# Patient Record
Sex: Male | Born: 1950
Health system: Southern US, Community
[De-identification: ages and names within clinical notes are randomized; demographics above are authoritative.]

## PROBLEM LIST (undated history)

## (undated) DIAGNOSIS — E782 Mixed hyperlipidemia: Secondary | ICD-10-CM

## (undated) DIAGNOSIS — I10 Essential (primary) hypertension: Secondary | ICD-10-CM

## (undated) DIAGNOSIS — I2510 Atherosclerotic heart disease of native coronary artery without angina pectoris: Secondary | ICD-10-CM

## (undated) DIAGNOSIS — I5022 Chronic systolic (congestive) heart failure: Secondary | ICD-10-CM

## (undated) DIAGNOSIS — T8859XA Other complications of anesthesia, initial encounter: Secondary | ICD-10-CM

## (undated) DIAGNOSIS — I219 Acute myocardial infarction, unspecified: Secondary | ICD-10-CM

## (undated) HISTORY — PX: HEMORRHOIDECTOMY WITH HEMORRHOID BANDING: SHX5633

## (undated) HISTORY — DX: Chronic systolic (congestive) heart failure: I50.22

## (undated) HISTORY — PX: XI ROBOTIC ASSISTED INGUINAL HERNIA REPAIR WITH MESH: SHX6706

## (undated) HISTORY — DX: Atherosclerotic heart disease of native coronary artery without angina pectoris: I25.10

---

## 2004-05-21 ENCOUNTER — Emergency Department (HOSPITAL_COMMUNITY): Admission: EM | Admit: 2004-05-21 | Discharge: 2004-05-21 | Payer: Self-pay | Admitting: Emergency Medicine

## 2009-11-02 ENCOUNTER — Ambulatory Visit (HOSPITAL_COMMUNITY): Admission: RE | Admit: 2009-11-02 | Discharge: 2009-11-02 | Payer: Self-pay | Admitting: Surgery

## 2010-05-28 LAB — SURGICAL PCR SCREEN: Staphylococcus aureus: NEGATIVE

## 2010-05-28 LAB — BASIC METABOLIC PANEL
Chloride: 104 mEq/L (ref 96–112)
GFR calc Af Amer: >60 mL/min (ref >60)
Potassium: 4.7 mEq/L (ref 3.5–5.1)
Sodium: 139 mEq/L (ref 135–145)

## 2010-05-28 LAB — DIFFERENTIAL
Eosinophils Relative: 4 % (ref 0–5)
Lymphocytes Relative: 32 % (ref 12–46)
Lymphs Abs: 1.3 10*3/uL (ref 0.7–4.0)
Monocytes Absolute: 0.4 10*3/uL (ref 0.1–1.0)
Monocytes Relative: 10 % (ref 3–12)

## 2010-05-28 LAB — CBC
HCT: 52 % (ref 39.0–52.0)
Hemoglobin: 18 g/dL — ABNORMAL HIGH (ref 13.0–17.0)
MCV: 101 fL — ABNORMAL HIGH (ref 78.0–100.0)
Platelets: 143 10*3/uL — ABNORMAL LOW (ref 150–400)
RBC: 5.15 MIL/uL (ref 4.22–5.81)
WBC: 3.9 10*3/uL — ABNORMAL LOW (ref 4.0–10.5)

## 2012-08-17 ENCOUNTER — Ambulatory Visit: Payer: Self-pay | Admitting: Orthopedic Surgery

## 2013-04-22 ENCOUNTER — Encounter (INDEPENDENT_AMBULATORY_CARE_PROVIDER_SITE_OTHER): Payer: 59 | Admitting: Ophthalmology

## 2013-04-22 DIAGNOSIS — H43819 Vitreous degeneration, unspecified eye: Secondary | ICD-10-CM

## 2013-04-22 DIAGNOSIS — H3530 Unspecified macular degeneration: Secondary | ICD-10-CM

## 2013-04-22 DIAGNOSIS — H2510 Age-related nuclear cataract, unspecified eye: Secondary | ICD-10-CM

## 2014-04-22 ENCOUNTER — Ambulatory Visit (INDEPENDENT_AMBULATORY_CARE_PROVIDER_SITE_OTHER): Payer: 59 | Admitting: Ophthalmology

## 2014-04-22 DIAGNOSIS — H43813 Vitreous degeneration, bilateral: Secondary | ICD-10-CM

## 2014-04-22 DIAGNOSIS — H3531 Nonexudative age-related macular degeneration: Secondary | ICD-10-CM

## 2015-04-23 ENCOUNTER — Ambulatory Visit (INDEPENDENT_AMBULATORY_CARE_PROVIDER_SITE_OTHER): Payer: PPO | Admitting: Ophthalmology

## 2015-04-23 DIAGNOSIS — H35033 Hypertensive retinopathy, bilateral: Secondary | ICD-10-CM | POA: Diagnosis not present

## 2015-04-23 DIAGNOSIS — H43813 Vitreous degeneration, bilateral: Secondary | ICD-10-CM | POA: Diagnosis not present

## 2015-04-23 DIAGNOSIS — H353122 Nonexudative age-related macular degeneration, left eye, intermediate dry stage: Secondary | ICD-10-CM | POA: Diagnosis not present

## 2015-04-23 DIAGNOSIS — H35373 Puckering of macula, bilateral: Secondary | ICD-10-CM | POA: Diagnosis not present

## 2015-04-23 DIAGNOSIS — I10 Essential (primary) hypertension: Secondary | ICD-10-CM | POA: Diagnosis not present

## 2016-02-24 DIAGNOSIS — E782 Mixed hyperlipidemia: Secondary | ICD-10-CM | POA: Diagnosis not present

## 2016-02-24 DIAGNOSIS — Z131 Encounter for screening for diabetes mellitus: Secondary | ICD-10-CM | POA: Diagnosis not present

## 2016-02-24 DIAGNOSIS — E559 Vitamin D deficiency, unspecified: Secondary | ICD-10-CM | POA: Diagnosis not present

## 2016-02-24 DIAGNOSIS — Z125 Encounter for screening for malignant neoplasm of prostate: Secondary | ICD-10-CM | POA: Diagnosis not present

## 2016-02-24 DIAGNOSIS — Z0000 Encounter for general adult medical examination without abnormal findings: Secondary | ICD-10-CM | POA: Diagnosis not present

## 2016-02-24 DIAGNOSIS — Z23 Encounter for immunization: Secondary | ICD-10-CM | POA: Diagnosis not present

## 2016-04-27 ENCOUNTER — Ambulatory Visit (INDEPENDENT_AMBULATORY_CARE_PROVIDER_SITE_OTHER): Payer: PPO | Admitting: Ophthalmology

## 2016-04-27 DIAGNOSIS — H353122 Nonexudative age-related macular degeneration, left eye, intermediate dry stage: Secondary | ICD-10-CM

## 2016-04-27 DIAGNOSIS — H35373 Puckering of macula, bilateral: Secondary | ICD-10-CM

## 2016-04-27 DIAGNOSIS — H43813 Vitreous degeneration, bilateral: Secondary | ICD-10-CM | POA: Diagnosis not present

## 2016-04-27 DIAGNOSIS — H2513 Age-related nuclear cataract, bilateral: Secondary | ICD-10-CM | POA: Diagnosis not present

## 2017-03-28 DIAGNOSIS — Z0000 Encounter for general adult medical examination without abnormal findings: Secondary | ICD-10-CM | POA: Diagnosis not present

## 2017-03-28 DIAGNOSIS — E559 Vitamin D deficiency, unspecified: Secondary | ICD-10-CM | POA: Diagnosis not present

## 2017-03-28 DIAGNOSIS — Z131 Encounter for screening for diabetes mellitus: Secondary | ICD-10-CM | POA: Diagnosis not present

## 2017-03-28 DIAGNOSIS — N400 Enlarged prostate without lower urinary tract symptoms: Secondary | ICD-10-CM | POA: Diagnosis not present

## 2017-03-28 DIAGNOSIS — Z23 Encounter for immunization: Secondary | ICD-10-CM | POA: Diagnosis not present

## 2017-03-28 DIAGNOSIS — E782 Mixed hyperlipidemia: Secondary | ICD-10-CM | POA: Diagnosis not present

## 2017-03-28 DIAGNOSIS — Z125 Encounter for screening for malignant neoplasm of prostate: Secondary | ICD-10-CM | POA: Diagnosis not present

## 2017-03-29 ENCOUNTER — Encounter (INDEPENDENT_AMBULATORY_CARE_PROVIDER_SITE_OTHER): Payer: PPO | Admitting: Ophthalmology

## 2017-03-29 DIAGNOSIS — H43813 Vitreous degeneration, bilateral: Secondary | ICD-10-CM

## 2017-03-29 DIAGNOSIS — H35373 Puckering of macula, bilateral: Secondary | ICD-10-CM | POA: Diagnosis not present

## 2017-03-29 DIAGNOSIS — H353122 Nonexudative age-related macular degeneration, left eye, intermediate dry stage: Secondary | ICD-10-CM

## 2017-03-29 DIAGNOSIS — H2513 Age-related nuclear cataract, bilateral: Secondary | ICD-10-CM

## 2017-05-03 ENCOUNTER — Ambulatory Visit (INDEPENDENT_AMBULATORY_CARE_PROVIDER_SITE_OTHER): Payer: PPO | Admitting: Ophthalmology

## 2018-04-04 ENCOUNTER — Encounter (INDEPENDENT_AMBULATORY_CARE_PROVIDER_SITE_OTHER): Payer: PPO | Admitting: Ophthalmology

## 2018-04-04 DIAGNOSIS — H43813 Vitreous degeneration, bilateral: Secondary | ICD-10-CM

## 2018-04-04 DIAGNOSIS — H353121 Nonexudative age-related macular degeneration, left eye, early dry stage: Secondary | ICD-10-CM | POA: Diagnosis not present

## 2018-04-04 DIAGNOSIS — H2513 Age-related nuclear cataract, bilateral: Secondary | ICD-10-CM

## 2018-04-04 DIAGNOSIS — H35373 Puckering of macula, bilateral: Secondary | ICD-10-CM

## 2018-05-07 DIAGNOSIS — Z0000 Encounter for general adult medical examination without abnormal findings: Secondary | ICD-10-CM | POA: Diagnosis not present

## 2018-05-07 DIAGNOSIS — Z131 Encounter for screening for diabetes mellitus: Secondary | ICD-10-CM | POA: Diagnosis not present

## 2018-05-07 DIAGNOSIS — N400 Enlarged prostate without lower urinary tract symptoms: Secondary | ICD-10-CM | POA: Diagnosis not present

## 2018-05-07 DIAGNOSIS — E782 Mixed hyperlipidemia: Secondary | ICD-10-CM | POA: Diagnosis not present

## 2018-05-07 DIAGNOSIS — E559 Vitamin D deficiency, unspecified: Secondary | ICD-10-CM | POA: Diagnosis not present

## 2018-05-07 DIAGNOSIS — Z23 Encounter for immunization: Secondary | ICD-10-CM | POA: Diagnosis not present

## 2018-05-07 DIAGNOSIS — Z125 Encounter for screening for malignant neoplasm of prostate: Secondary | ICD-10-CM | POA: Diagnosis not present

## 2018-05-08 DIAGNOSIS — K635 Polyp of colon: Secondary | ICD-10-CM | POA: Diagnosis not present

## 2018-05-08 DIAGNOSIS — D123 Benign neoplasm of transverse colon: Secondary | ICD-10-CM | POA: Diagnosis not present

## 2018-05-08 DIAGNOSIS — K5730 Diverticulosis of large intestine without perforation or abscess without bleeding: Secondary | ICD-10-CM | POA: Diagnosis not present

## 2018-05-08 DIAGNOSIS — Z86010 Personal history of colonic polyps: Secondary | ICD-10-CM | POA: Diagnosis not present

## 2018-05-11 DIAGNOSIS — D123 Benign neoplasm of transverse colon: Secondary | ICD-10-CM | POA: Diagnosis not present

## 2018-05-11 DIAGNOSIS — K635 Polyp of colon: Secondary | ICD-10-CM | POA: Diagnosis not present

## 2018-09-16 ENCOUNTER — Inpatient Hospital Stay (HOSPITAL_COMMUNITY)
Admission: EM | Disposition: A | Discharge status: Home / Self Care | Payer: Self-pay | Source: Home / Self Care | Attending: Cardiovascular Disease

## 2018-09-16 ENCOUNTER — Emergency Department (HOSPITAL_COMMUNITY): Admit: 2018-09-16 | Payer: Self-pay | Admitting: Cardiovascular Disease

## 2018-09-16 ENCOUNTER — Encounter (HOSPITAL_COMMUNITY): Payer: Self-pay | Admitting: Cardiovascular Disease

## 2018-09-16 ENCOUNTER — Inpatient Hospital Stay (HOSPITAL_COMMUNITY)
Admission: EM | Admit: 2018-09-16 | Discharge: 2018-09-19 | DRG: 246 | Disposition: A | Discharge status: Home / Self Care | Payer: PPO | Attending: Cardiovascular Disease | Admitting: Cardiovascular Disease

## 2018-09-16 DIAGNOSIS — I499 Cardiac arrhythmia, unspecified: Secondary | ICD-10-CM | POA: Diagnosis not present

## 2018-09-16 DIAGNOSIS — Y840 Cardiac catheterization as the cause of abnormal reaction of the patient, or of later complication, without mention of misadventure at the time of the procedure: Secondary | ICD-10-CM | POA: Diagnosis not present

## 2018-09-16 DIAGNOSIS — R0602 Shortness of breath: Secondary | ICD-10-CM | POA: Diagnosis not present

## 2018-09-16 DIAGNOSIS — Z20828 Contact with and (suspected) exposure to other viral communicable diseases: Secondary | ICD-10-CM | POA: Diagnosis not present

## 2018-09-16 DIAGNOSIS — I2119 ST elevation (STEMI) myocardial infarction involving other coronary artery of inferior wall: Secondary | ICD-10-CM | POA: Diagnosis present

## 2018-09-16 DIAGNOSIS — Y92230 Patient room in hospital as the place of occurrence of the external cause: Secondary | ICD-10-CM | POA: Diagnosis not present

## 2018-09-16 DIAGNOSIS — R079 Chest pain, unspecified: Secondary | ICD-10-CM | POA: Diagnosis not present

## 2018-09-16 DIAGNOSIS — Z955 Presence of coronary angioplasty implant and graft: Secondary | ICD-10-CM

## 2018-09-16 DIAGNOSIS — T45525A Adverse effect of antithrombotic drugs, initial encounter: Secondary | ICD-10-CM | POA: Diagnosis not present

## 2018-09-16 DIAGNOSIS — E663 Overweight: Secondary | ICD-10-CM | POA: Diagnosis not present

## 2018-09-16 DIAGNOSIS — I10 Essential (primary) hypertension: Secondary | ICD-10-CM | POA: Diagnosis not present

## 2018-09-16 DIAGNOSIS — L7632 Postprocedural hematoma of skin and subcutaneous tissue following other procedure: Secondary | ICD-10-CM | POA: Diagnosis not present

## 2018-09-16 DIAGNOSIS — I2111 ST elevation (STEMI) myocardial infarction involving right coronary artery: Secondary | ICD-10-CM | POA: Diagnosis not present

## 2018-09-16 DIAGNOSIS — Z888 Allergy status to other drugs, medicaments and biological substances status: Secondary | ICD-10-CM | POA: Diagnosis not present

## 2018-09-16 DIAGNOSIS — I110 Hypertensive heart disease with heart failure: Secondary | ICD-10-CM | POA: Diagnosis not present

## 2018-09-16 DIAGNOSIS — I2510 Atherosclerotic heart disease of native coronary artery without angina pectoris: Secondary | ICD-10-CM | POA: Diagnosis not present

## 2018-09-16 DIAGNOSIS — I213 ST elevation (STEMI) myocardial infarction of unspecified site: Secondary | ICD-10-CM | POA: Diagnosis not present

## 2018-09-16 DIAGNOSIS — E782 Mixed hyperlipidemia: Secondary | ICD-10-CM | POA: Diagnosis present

## 2018-09-16 DIAGNOSIS — R1111 Vomiting without nausea: Secondary | ICD-10-CM | POA: Diagnosis not present

## 2018-09-16 DIAGNOSIS — I5021 Acute systolic (congestive) heart failure: Secondary | ICD-10-CM | POA: Diagnosis present

## 2018-09-16 DIAGNOSIS — I255 Ischemic cardiomyopathy: Secondary | ICD-10-CM | POA: Diagnosis not present

## 2018-09-16 HISTORY — PX: CORONARY/GRAFT ACUTE MI REVASCULARIZATION: CATH118305

## 2018-09-16 HISTORY — DX: Mixed hyperlipidemia: E78.2

## 2018-09-16 HISTORY — PX: LEFT HEART CATH AND CORONARY ANGIOGRAPHY: CATH118249

## 2018-09-16 HISTORY — PX: CORONARY STENT INTERVENTION: CATH118234

## 2018-09-16 HISTORY — DX: Essential (primary) hypertension: I10

## 2018-09-16 LAB — COMPREHENSIVE METABOLIC PANEL
ALT: 31 U/L (ref 0–44)
AST: 28 U/L (ref 15–41)
Albumin: 4.1 g/dL (ref 3.5–5.0)
Alkaline Phosphatase: 72 U/L (ref 38–126)
Anion gap: 9 (ref 5–15)
BUN: 15 mg/dL (ref 8–23)
CO2: 24 mmol/L (ref 22–32)
Calcium: 9.1 mg/dL (ref 8.9–10.3)
Chloride: 105 mmol/L (ref 98–111)
Creatinine, Ser: 1.02 mg/dL (ref 0.61–1.24)
GFR calc Af Amer: >60 mL/min (ref >60)
GFR calc non Af Amer: >60 mL/min (ref >60)
Glucose, Bld: 144 mg/dL — ABNORMAL HIGH (ref 70–99)
Potassium: 3.8 mmol/L (ref 3.5–5.1)
Sodium: 138 mmol/L (ref 135–145)
Total Bilirubin: 0.5 mg/dL (ref 0.3–1.2)
Total Protein: 6.8 g/dL (ref 6.5–8.1)

## 2018-09-16 LAB — LIPID PANEL
Cholesterol: 193 mg/dL (ref 0–200)
HDL: 32 mg/dL — ABNORMAL LOW (ref >40)
LDL Cholesterol: 139 mg/dL — ABNORMAL HIGH (ref 0–99)
Total CHOL/HDL Ratio: 6 RATIO
Triglycerides: 110 mg/dL (ref <150)
VLDL: 22 mg/dL (ref 0–40)

## 2018-09-16 LAB — HEMOGLOBIN A1C
Hgb A1c MFr Bld: 6.4 % — ABNORMAL HIGH (ref 4.8–5.6)
Hgb A1c MFr Bld: 6.3 % — ABNORMAL HIGH (ref 4.8–5.6)
Mean Plasma Glucose: 136.98 mg/dL
Mean Plasma Glucose: 134.11 mg/dL

## 2018-09-16 LAB — TROPONIN I (HIGH SENSITIVITY)
Troponin I (High Sensitivity): 157 ng/L (ref <18)
Troponin I (High Sensitivity): 2919 ng/L (ref <18)
Troponin I (High Sensitivity): 5321 ng/L (ref <18)

## 2018-09-16 LAB — CBC
HCT: 46.1 % (ref 39.0–52.0)
Hemoglobin: 15.9 g/dL (ref 13.0–17.0)
MCH: 32.5 pg (ref 26.0–34.0)
MCHC: 34.5 g/dL (ref 30.0–36.0)
MCV: 94.3 fL (ref 80.0–100.0)
Platelets: 187 10*3/uL (ref 150–400)
RBC: 4.89 MIL/uL (ref 4.22–5.81)
RDW: 12.6 % (ref 11.5–15.5)
WBC: 7.2 10*3/uL (ref 4.0–10.5)
nRBC: 0 % (ref 0.0–0.2)

## 2018-09-16 LAB — TYPE AND SCREEN
ABO/RH(D): O POS
Antibody Screen: NEGATIVE

## 2018-09-16 LAB — PROTIME-INR
INR: 1.3 — ABNORMAL HIGH (ref 0.8–1.2)
Prothrombin Time: 15.5 seconds — ABNORMAL HIGH (ref 11.4–15.2)

## 2018-09-16 LAB — SARS CORONAVIRUS 2 BY RT PCR (HOSPITAL ORDER, PERFORMED IN ~~LOC~~ HOSPITAL LAB): SARS Coronavirus 2: NEGATIVE

## 2018-09-16 LAB — ABO/RH: ABO/RH(D): O POS

## 2018-09-16 LAB — MRSA PCR SCREENING: MRSA by PCR: NEGATIVE

## 2018-09-16 LAB — APTT: aPTT: >200 seconds (ref 24–36)

## 2018-09-16 SURGERY — CORONARY/GRAFT ACUTE MI REVASCULARIZATION
Anesthesia: LOCAL

## 2018-09-16 MED ORDER — ACETAMINOPHEN 325 MG PO TABS: 650.0000 mg | ORAL_TABLET | ORAL | Status: DC | PRN

## 2018-09-16 MED ORDER — HYDRALAZINE HCL 20 MG/ML IJ SOLN: 10.0000 mg | INTRAMUSCULAR | Status: AC | PRN

## 2018-09-16 MED ORDER — FENTANYL CITRATE (PF) 100 MCG/2ML IJ SOLN
INTRAMUSCULAR | Status: DC | PRN
  Administered 2018-09-16: 50 ug via INTRAVENOUS
  Administered 2018-09-16: 25 ug via INTRAVENOUS
  Administered 2018-09-16: 50 ug via INTRAVENOUS

## 2018-09-16 MED ORDER — LIDOCAINE HCL (PF) 1 % IJ SOLN
INTRAMUSCULAR | Status: AC
  Filled 2018-09-16: qty 30

## 2018-09-16 MED ORDER — NITROGLYCERIN 1 MG/10 ML FOR IR/CATH LAB
INTRA_ARTERIAL | Status: AC
  Filled 2018-09-16: qty 10

## 2018-09-16 MED ORDER — METOPROLOL TARTRATE 25 MG PO TABS
25.0000 mg | ORAL_TABLET | Freq: Two times a day (BID) | ORAL | Status: DC
  Administered 2018-09-16 – 2018-09-18 (×5): 25 mg via ORAL
  Filled 2018-09-16 (×5): qty 1

## 2018-09-16 MED ORDER — SODIUM CHLORIDE 0.9 % IV SOLN
INTRAVENOUS | Status: AC | PRN
  Administered 2018-09-16: 75 mL/h via INTRAVENOUS

## 2018-09-16 MED ORDER — SODIUM CHLORIDE 0.9 % WEIGHT BASED INFUSION: 1.0000 mL/kg/h | INTRAVENOUS | Status: AC

## 2018-09-16 MED ORDER — TICAGRELOR 90 MG PO TABS
ORAL_TABLET | ORAL | Status: DC | PRN
  Administered 2018-09-16: 180 mg via ORAL

## 2018-09-16 MED ORDER — TIROFIBAN (AGGRASTAT) BOLUS VIA INFUSION
INTRAVENOUS | Status: DC | PRN
  Administered 2018-09-16: 17:00:00 2492.5 ug via INTRAVENOUS

## 2018-09-16 MED ORDER — HEPARIN SODIUM (PORCINE) 1000 UNIT/ML IJ SOLN
INTRAMUSCULAR | Status: AC
  Filled 2018-09-16: qty 1

## 2018-09-16 MED ORDER — TICAGRELOR 90 MG PO TABS
90.0000 mg | ORAL_TABLET | Freq: Two times a day (BID) | ORAL | Status: DC
  Administered 2018-09-16 – 2018-09-19 (×6): 90 mg via ORAL
  Filled 2018-09-16 (×6): qty 1

## 2018-09-16 MED ORDER — OXYCODONE HCL 5 MG PO TABS
5.0000 mg | ORAL_TABLET | ORAL | Status: DC | PRN
  Administered 2018-09-17: 10 mg via ORAL
  Filled 2018-09-16: qty 2

## 2018-09-16 MED ORDER — TIROFIBAN HCL IN NACL 5-0.9 MG/100ML-% IV SOLN
INTRAVENOUS | Status: AC
  Filled 2018-09-16: qty 100

## 2018-09-16 MED ORDER — NITROGLYCERIN 1 MG/10 ML FOR IR/CATH LAB
INTRA_ARTERIAL | Status: DC | PRN
  Administered 2018-09-16 (×2): 200 ug via INTRACORONARY

## 2018-09-16 MED ORDER — MORPHINE SULFATE (PF) 2 MG/ML IV SOLN
2.0000 mg | INTRAVENOUS | Status: DC | PRN
  Administered 2018-09-16: 2 mg via INTRAVENOUS
  Filled 2018-09-16: qty 1

## 2018-09-16 MED ORDER — LIDOCAINE HCL (PF) 1 % IJ SOLN
INTRAMUSCULAR | Status: DC | PRN
  Administered 2018-09-16: 2 mL
  Administered 2018-09-16: 15 mL

## 2018-09-16 MED ORDER — MIDAZOLAM HCL 2 MG/2ML IJ SOLN
INTRAMUSCULAR | Status: AC
  Filled 2018-09-16: qty 2

## 2018-09-16 MED ORDER — FENTANYL CITRATE (PF) 100 MCG/2ML IJ SOLN
INTRAMUSCULAR | Status: AC
  Filled 2018-09-16: qty 2

## 2018-09-16 MED ORDER — ONDANSETRON HCL 4 MG/2ML IJ SOLN: 4.0000 mg | Freq: Four times a day (QID) | INTRAMUSCULAR | Status: DC | PRN

## 2018-09-16 MED ORDER — VERAPAMIL HCL 2.5 MG/ML IV SOLN
INTRAVENOUS | Status: AC
  Filled 2018-09-16: qty 2

## 2018-09-16 MED ORDER — SODIUM CHLORIDE 0.9 % IV SOLN: 250.0000 mL | INTRAVENOUS | Status: DC | PRN

## 2018-09-16 MED ORDER — MIDAZOLAM HCL 2 MG/2ML IJ SOLN
INTRAMUSCULAR | Status: DC | PRN
  Administered 2018-09-16 (×2): 2 mg via INTRAVENOUS
  Administered 2018-09-16: 1 mg via INTRAVENOUS

## 2018-09-16 MED ORDER — SODIUM CHLORIDE 0.9% FLUSH: 3.0000 mL | INTRAVENOUS | Status: DC | PRN

## 2018-09-16 MED ORDER — HEPARIN (PORCINE) IN NACL 1000-0.9 UT/500ML-% IV SOLN
INTRAVENOUS | Status: DC | PRN
  Administered 2018-09-16 (×2): 500 mL

## 2018-09-16 MED ORDER — TIROFIBAN HCL IN NACL 5-0.9 MG/100ML-% IV SOLN
INTRAVENOUS | Status: DC | PRN
  Administered 2018-09-16: 0.15 ug/kg/min via INTRAVENOUS

## 2018-09-16 MED ORDER — SODIUM CHLORIDE 0.9% FLUSH
3.0000 mL | Freq: Two times a day (BID) | INTRAVENOUS | Status: DC
  Administered 2018-09-16 – 2018-09-19 (×6): 3 mL via INTRAVENOUS

## 2018-09-16 MED ORDER — HEPARIN SODIUM (PORCINE) 1000 UNIT/ML IJ SOLN
INTRAMUSCULAR | Status: DC | PRN
  Administered 2018-09-16: 10000 [IU] via INTRAVENOUS

## 2018-09-16 MED ORDER — NITROGLYCERIN 0.4 MG SL SUBL: 0.4000 mg | SUBLINGUAL_TABLET | SUBLINGUAL | Status: DC | PRN

## 2018-09-16 MED ORDER — HEPARIN (PORCINE) IN NACL 1000-0.9 UT/500ML-% IV SOLN
INTRAVENOUS | Status: AC
  Filled 2018-09-16: qty 1000

## 2018-09-16 MED ORDER — LABETALOL HCL 5 MG/ML IV SOLN: 10.0000 mg | INTRAVENOUS | Status: AC | PRN

## 2018-09-16 MED ORDER — TICAGRELOR 90 MG PO TABS
ORAL_TABLET | ORAL | Status: AC
  Filled 2018-09-16: qty 2

## 2018-09-16 MED ORDER — HEPARIN (PORCINE) IN NACL 1000-0.9 UT/500ML-% IV SOLN
INTRAVENOUS | Status: DC | PRN
  Administered 2018-09-16 (×2): 500 mL

## 2018-09-16 MED ORDER — ASPIRIN EC 81 MG PO TBEC
81.0000 mg | DELAYED_RELEASE_TABLET | Freq: Every day | ORAL | Status: DC
  Administered 2018-09-17: 10:00:00 81 mg via ORAL
  Filled 2018-09-16: qty 1

## 2018-09-16 SURGICAL SUPPLY — 25 items
BALLN SAPPHIRE 2.5X15 (BALLOONS) ×2
BALLN SAPPHIRE ~~LOC~~ 4.0X18 (BALLOONS) ×2 IMPLANT
BALLOON SAPPHIRE 2.5X15 (BALLOONS) ×1 IMPLANT
CATH 5FR JL3.5 JR4 ANG PIG MP (CATHETERS) ×2 IMPLANT
CATH EXTRAC PRONTO LP 6F RND (CATHETERS) ×2 IMPLANT
CATH INFINITI 5FR JL4 (CATHETERS) ×2 IMPLANT
CATH LAUNCHER 6FR JR4 (CATHETERS) ×2 IMPLANT
DEVICE CLOSURE PERCLS PRGLD 6F (VASCULAR PRODUCTS) ×1 IMPLANT
FEM STOP ARCH (HEMOSTASIS) ×1
GLIDESHEATH SLEND SS 6F .021 (SHEATH) ×2 IMPLANT
GUIDEWIRE INQWIRE 1.5J.035X260 (WIRE) ×1 IMPLANT
INQWIRE 1.5J .035X260CM (WIRE) ×2
KIT ENCORE 26 ADVANTAGE (KITS) ×2 IMPLANT
KIT HEART LEFT (KITS) ×2 IMPLANT
PACK CARDIAC CATHETERIZATION (CUSTOM PROCEDURE TRAY) ×2 IMPLANT
PERCLOSE PROGLIDE 6F (VASCULAR PRODUCTS) ×2
SHEATH PINNACLE 6F 10CM (SHEATH) ×2 IMPLANT
SHEATH PROBE COVER 6X72 (BAG) ×2 IMPLANT
STENT RESOLUTE ONYX 4.0X30 (Permanent Stent) ×2 IMPLANT
SYR MEDRAD MARK 7 150ML (SYRINGE) ×2 IMPLANT
SYSTEM COMPRESSION FEMOSTOP (HEMOSTASIS) ×1 IMPLANT
TRANSDUCER W/STOPCOCK (MISCELLANEOUS) ×2 IMPLANT
TUBING CIL FLEX 10 FLL-RA (TUBING) ×2 IMPLANT
WIRE COUGAR XT STRL 190CM (WIRE) ×2 IMPLANT
WIRE EMERALD 3MM-J .035X150CM (WIRE) ×2 IMPLANT

## 2018-09-16 NOTE — H&P (Signed)
Cardiology Admission History and Physical:   Patient ID: Eric Pace MRN: [id number]; DOB: May 19, 1950   Admission date: 09/16/2018  Primary Care Provider: [name], MD Primary Cardiologist: No primary care provider on file.  Primary Electrophysiologist:  None   Chief Complaint:  Chest pain  Patient Profile:   Eric Pace is a 68 y.o. male with no significant past medical history who presents with acute onset chest pain and is diagnosed as a code STEMI from the field, brought directly to the cardiac catheterization lab.  History of Present Illness:   Eric Pace is 69 years old with no significant medical problems.  He only takes eyedrops for medication.  States that he has had "borderline high blood pressure".  He has been statin intolerant.  He was in his normal state of health until 1 PM today when he developed abrupt onset of substernal chest pain radiating to both shoulders, characterized as a pressure-like sensation.  There was associated nausea but no vomiting.  There was associated diaphoresis.  He denies shortness of breath, orthopnea, or PND.  He denies lightheadedness or syncope.  He was driven by a friend towards the hospital, but decided to stop at the fire station because he was feeling so poorly.  He was picked up by EMS at the fire station and an EKG demonstrated an acute inferior STEMI.  A code STEMI is called and he is brought directly to the cardiac catheterization lab after briefly stopping in the emergency department for a COVID-19 swab.  The patient has had no symptoms suggestive of COVID-19, nor has he had any sick contacts.  Heart Pathway Score:     Past Medical History:  Diagnosis Date  . Essential hypertension   . Mixed hyperlipidemia     Past Surgical History:  Procedure Laterality Date  . HEMORRHOIDECTOMY WITH HEMORRHOID BANDING       Medications Prior to Admission: Prior to Admission medications   Not on File     Allergies:   Not on File   Social History:   Social History   Socioeconomic History  . Marital status: Married    Spouse name: Not on file  . Number of children: Not on file  . Years of education: Not on file  . Highest education level: Not on file  Occupational History  . Not on file  Social Needs  . Financial resource strain: Not on file  . Food insecurity    Worry: Not on file    Inability: Not on file  . Transportation needs    Medical: Not on file    Non-medical: Not on file  Tobacco Use  . Smoking status: Never Smoker  . Smokeless tobacco: Never Used  Substance and Sexual Activity  . Alcohol use: Not Currently  . Drug use: Never  . Sexual activity: Not on file  Lifestyle  . Physical activity    Days per week: Not on file    Minutes per session: Not on file  . Stress: Not on file  Relationships  . Social Herbalist on phone: Not on file    Gets together: Not on file    Attends religious service: Not on file    Active member of club or organization: Not on file    Attends meetings of clubs or organizations: Not on file    Relationship status: Not on file  . Intimate partner violence    Fear of current or ex partner: Not on  file    Emotionally abused: Not on file    Physically abused: Not on file    Forced sexual activity: Not on file  Other Topics Concern  . Not on file  Social History Narrative  . Not on file    Family History:   The patient's family history is not on file. He was adopted.  Family history is unknown as he was adopted.  ROS:  Please see the history of present illness.  All other ROS reviewed and negative.     Physical Exam/Data:  There were no vitals filed for this visit. No intake or output data in the 24 hours ending 09/16/18 1620 No flowsheet data found.   There is no height or weight on file to calculate BMI.  General:  Well nourished, well developed, in mild to moderate acute distress secondary to chest discomfort.  No respiratory distress.  HEENT: normal Lymph: no adenopathy Neck: no JVD Endocrine:  No thryomegaly Vascular: No carotid bruits; FA pulses 2+ bilaterally Cardiac:  normal S1, S2; RRR; no murmur  Lungs:  clear to auscultation bilaterally, no wheezing, rhonchi or rales  Abd: soft, nontender, no hepatomegaly  Ext: no edema Musculoskeletal:  No deformities, BUE and BLE strength normal and equal Skin: warm and dry  Neuro:  CNs 2-12 intact, no focal abnormalities noted Psych:  Normal affect    EKG:  The ECG that was done today was personally reviewed and demonstrates normal sinus rhythm with acute inferoposterior STEMI pattern  Relevant CV Studies: Pending  Laboratory Data:  High Sensitivity Troponin:  No results for input(s): TROPONINIHS in the last 720 hours.    Cardiac EnzymesNo results for input(s): TROPONINI in the last 168 hours. No results for input(s): TROPIPOC in the last 168 hours.  ChemistryNo results for input(s): NA, K, CL, CO2, GLUCOSE, BUN, CREATININE, CALCIUM, GFRNONAA, GFRAA, ANIONGAP in the last 168 hours.  No results for input(s): PROT, ALBUMIN, AST, ALT, ALKPHOS, BILITOT in the last 168 hours. HematologyNo results for input(s): WBC, RBC, HGB, HCT, MCV, MCH, MCHC, RDW, PLT in the last 168 hours. BNPNo results for input(s): BNP, PROBNP in the last 168 hours.  DDimer No results for input(s): DDIMER in the last 168 hours.   Radiology/Studies:  No results found.  Assessment and Plan:   1. Acute inferoposterior STEMI: The patient is brought directly to the cardiac catheterization lab for emergency catheterization and primary PCI.  He has received aspirin 500 mg orally administered by himself at home prior to arrival.  No other pharmacotherapy has been administered at this point.  Anticipate full dose IV heparin, ticagrelor loading, and possibility of a to be 3 a inhibitor pending thrombus burden.  Further plan/disposition pending his cardiac catheterization results. 2. Hypertension: We will  start him on a beta-blocker and adjust his antihypertensive therapy as indicated. 3. Suspected mixed hyperlipidemia: We will check fasting lipids and try him on a statin drug if he can tolerate.  He does report a statin allergy with facial numbness as the primary symptom.  We may need to consider a PCSK9 inhibitor.  Severity of Illness: The appropriate patient status for this patient is INPATIENT. Inpatient status is judged to be reasonable and necessary in order to provide the required intensity of service to ensure the patient's safety. The patient's presenting symptoms, physical exam findings, and initial radiographic and laboratory data in the context of their chronic comorbidities is felt to place them at high risk for further clinical deterioration. Furthermore, it is  not anticipated that the patient will be medically stable for discharge from the hospital within 2 midnights of admission.    * I certify that at the point of admission it is my clinical judgment that the patient will require inpatient hospital care spanning beyond 2 midnights from the point of admission due to high intensity of service, high risk for further deterioration and high frequency of surveillance required.*    For questions or updates, please contact [place] Please consult [email or web address] for contact info under        Signed, Sherren Mocha, MD  09/16/2018 4:20 PM

## 2018-09-16 NOTE — Progress Notes (Signed)
Dr. Burt Knack paged re:  Results of PTT and Trop.  No new orders received. Will continue to monitor closely.

## 2018-09-16 NOTE — ED Notes (Signed)
Wife, Brayam Boeke [phone]

## 2018-09-16 NOTE — Progress Notes (Signed)
This chaplain phoned the ED at the anticipated arrival time for Pt. Code Stemi.  The chaplain was informed by [place], [place]. has not arrived.  The chaplain understands the ED will contact spiritual care as needed.

## 2018-09-17 ENCOUNTER — Other Ambulatory Visit: Payer: Self-pay

## 2018-09-17 ENCOUNTER — Encounter (HOSPITAL_COMMUNITY): Payer: Self-pay | Admitting: Cardiovascular Disease

## 2018-09-17 ENCOUNTER — Inpatient Hospital Stay (HOSPITAL_COMMUNITY): Payer: PPO

## 2018-09-17 DIAGNOSIS — I2111 ST elevation (STEMI) myocardial infarction involving right coronary artery: Secondary | ICD-10-CM

## 2018-09-17 LAB — BASIC METABOLIC PANEL
Anion gap: 10 (ref 5–15)
Anion gap: 14 (ref 5–15)
BUN: 14 mg/dL (ref 8–23)
BUN: 13 mg/dL (ref 8–23)
CO2: 24 mmol/L (ref 22–32)
CO2: 18 mmol/L — ABNORMAL LOW (ref 22–32)
Calcium: 8.9 mg/dL (ref 8.9–10.3)
Calcium: 9 mg/dL (ref 8.9–10.3)
Chloride: 103 mmol/L (ref 98–111)
Chloride: 103 mmol/L (ref 98–111)
Creatinine, Ser: 0.87 mg/dL (ref 0.61–1.24)
Creatinine, Ser: 1.02 mg/dL (ref 0.61–1.24)
GFR calc Af Amer: >60 mL/min (ref >60)
GFR calc Af Amer: >60 mL/min (ref >60)
GFR calc non Af Amer: >60 mL/min (ref >60)
GFR calc non Af Amer: >60 mL/min (ref >60)
Glucose, Bld: 139 mg/dL — ABNORMAL HIGH (ref 70–99)
Glucose, Bld: 129 mg/dL — ABNORMAL HIGH (ref 70–99)
Potassium: 3.7 mmol/L (ref 3.5–5.1)
Potassium: 3.9 mmol/L (ref 3.5–5.1)
Sodium: 137 mmol/L (ref 135–145)
Sodium: 135 mmol/L (ref 135–145)

## 2018-09-17 LAB — CBC
HCT: 42.7 % (ref 39.0–52.0)
HCT: 43.3 % (ref 39.0–52.0)
Hemoglobin: 14.7 g/dL (ref 13.0–17.0)
Hemoglobin: 14.7 g/dL (ref 13.0–17.0)
MCH: 32.7 pg (ref 26.0–34.0)
MCH: 32.5 pg (ref 26.0–34.0)
MCHC: 34.4 g/dL (ref 30.0–36.0)
MCHC: 33.9 g/dL (ref 30.0–36.0)
MCV: 94.9 fL (ref 80.0–100.0)
MCV: 95.6 fL (ref 80.0–100.0)
Platelets: 166 10*3/uL (ref 150–400)
Platelets: 192 10*3/uL (ref 150–400)
RBC: 4.5 MIL/uL (ref 4.22–5.81)
RBC: 4.53 MIL/uL (ref 4.22–5.81)
RDW: 12.6 % (ref 11.5–15.5)
RDW: 12.8 % (ref 11.5–15.5)
WBC: 9.8 10*3/uL (ref 4.0–10.5)
WBC: 9.1 10*3/uL (ref 4.0–10.5)
nRBC: 0 % (ref 0.0–0.2)
nRBC: 0 % (ref 0.0–0.2)

## 2018-09-17 LAB — POCT I-STAT 7, (LYTES, BLD GAS, ICA,H+H)
Bicarbonate: 26.5 mmol/L (ref 20.0–28.0)
Calcium, Ion: 1.27 mmol/L (ref 1.15–1.40)
HCT: 48 % (ref 39.0–52.0)
Hemoglobin: 16.3 g/dL (ref 13.0–17.0)
O2 Saturation: 93 %
Potassium: 3.8 mmol/L (ref 3.5–5.1)
Sodium: 141 mmol/L (ref 135–145)
TCO2: 28 mmol/L (ref 22–32)
pCO2 arterial: 47.4 mmHg (ref 32.0–48.0)
pH, Arterial: 7.356 (ref 7.350–7.450)
pO2, Arterial: 70 mmHg — ABNORMAL LOW (ref 83.0–108.0)

## 2018-09-17 LAB — LIPID PANEL
Cholesterol: 176 mg/dL (ref 0–200)
HDL: 31 mg/dL — ABNORMAL LOW (ref >40)
LDL Cholesterol: 101 mg/dL — ABNORMAL HIGH (ref 0–99)
Total CHOL/HDL Ratio: 5.7 RATIO
Triglycerides: 222 mg/dL — ABNORMAL HIGH (ref <150)
VLDL: 44 mg/dL — ABNORMAL HIGH (ref 0–40)

## 2018-09-17 LAB — ECHOCARDIOGRAM COMPLETE: Weight: 3608.49 oz

## 2018-09-17 LAB — POCT I-STAT CREATININE: Creatinine, Ser: 0.9 mg/dL (ref 0.61–1.24)

## 2018-09-17 LAB — POCT ACTIVATED CLOTTING TIME
Activated Clotting Time: 257 seconds
Activated Clotting Time: 268 seconds

## 2018-09-17 LAB — TROPONIN I (HIGH SENSITIVITY)
Troponin I (High Sensitivity): 8585 ng/L (ref <18)
Troponin I (High Sensitivity): 6239 ng/L (ref <18)

## 2018-09-17 MED ORDER — SODIUM CHLORIDE 0.9 % WEIGHT BASED INFUSION
1.0000 mL/kg/h | INTRAVENOUS | Status: DC
  Administered 2018-09-17: 1 mL/kg/h via INTRAVENOUS

## 2018-09-17 MED ORDER — ASPIRIN 81 MG PO CHEW
81.0000 mg | CHEWABLE_TABLET | ORAL | Status: AC
  Administered 2018-09-18: 06:00:00 81 mg via ORAL
  Filled 2018-09-17: qty 1

## 2018-09-17 MED ORDER — SODIUM CHLORIDE 0.9 % IV SOLN: 250.0000 mL | INTRAVENOUS | Status: DC | PRN

## 2018-09-17 MED ORDER — EZETIMIBE 10 MG PO TABS
10.0000 mg | ORAL_TABLET | Freq: Every day | ORAL | Status: DC
  Administered 2018-09-17 – 2018-09-19 (×3): 10 mg via ORAL
  Filled 2018-09-17 (×3): qty 1

## 2018-09-17 MED ORDER — SODIUM CHLORIDE 0.9% FLUSH: 3.0000 mL | INTRAVENOUS | Status: DC | PRN

## 2018-09-17 MED ORDER — TICAGRELOR 90 MG PO TABS: 90.0000 mg | ORAL_TABLET | Freq: Two times a day (BID) | ORAL | Status: DC

## 2018-09-17 MED ORDER — CHLORHEXIDINE GLUCONATE CLOTH 2 % EX PADS
6.0000 | MEDICATED_PAD | Freq: Every day | CUTANEOUS | Status: DC
  Administered 2018-09-17 – 2018-09-18 (×2): 6 via TOPICAL

## 2018-09-17 MED ORDER — ASPIRIN EC 81 MG PO TBEC
81.0000 mg | DELAYED_RELEASE_TABLET | Freq: Every day | ORAL | Status: DC
  Administered 2018-09-19: 81 mg via ORAL
  Filled 2018-09-17: qty 1

## 2018-09-17 MED ORDER — SODIUM CHLORIDE 0.9% FLUSH
3.0000 mL | Freq: Two times a day (BID) | INTRAVENOUS | Status: DC
  Administered 2018-09-17: 3 mL via INTRAVENOUS

## 2018-09-17 MED FILL — Verapamil HCl IV Soln 2.5 MG/ML: INTRAVENOUS | Qty: 2 | Status: AC

## 2018-09-17 MED FILL — Heparin Sod (Porcine)-NaCl IV Soln 1000 Unit/500ML-0.9%: INTRAVENOUS | Qty: 1000 | Status: AC

## 2018-09-17 NOTE — Progress Notes (Signed)
Progress Note  Patient Name: Eric Pace Date of Encounter: 09/17/2018  Primary Cardiologist: Dr. Sherren Mocha  Subjective   Postop day #1 inferoposterior STEMI treated with PCI and drug-eluting stenting of a dominant RCA with residual LAD/diagonal branch bifurcation disease.  He feels clinically improved denies chest pain.  Did have some shortness of breath this morning potentially related to Brilinta.  He has a large area of ecchymosis in his groin extending to his scrotum.  Inpatient Medications    Scheduled Meds: . aspirin EC  81 mg Oral Daily  . metoprolol tartrate  25 mg Oral BID  . sodium chloride flush  3 mL Intravenous Q12H  . ticagrelor  90 mg Oral BID   Continuous Infusions: . sodium chloride     PRN Meds: sodium chloride, acetaminophen, morphine injection, nitroGLYCERIN, ondansetron (ZOFRAN) IV, oxyCODONE, sodium chloride flush   Vital Signs    Vitals:   09/17/18 0500 09/17/18 0600 09/17/18 0700 09/17/18 0816  BP: 139/85 (!) 160/93 140/84   Pulse: 65 83 73   Resp: 14 17 18    Temp:    98.6 F (37 C)  TempSrc:    Oral  SpO2: 99% 99% 99%   Weight:        Intake/Output Summary (Last 24 hours) at 09/17/2018 0828 Last data filed at 09/17/2018 0816 Gross per 24 hour  Intake 1100 ml  Output 725 ml  Net 375 ml   Last 3 Weights 09/16/2018  Weight (lbs) 225 lb 8.5 oz  Weight (kg) 102.3 kg      Telemetry    Sinus rhythm- Personally Reviewed  ECG    Normal sinus rhythm at 72 with inferior Q waves and improved lateral T wave inversion- Personally Reviewed  Physical Exam   GEN: No acute distress.   Neck: No JVD Cardiac: RRR, no murmurs, rubs, or gallops.  Respiratory: Clear to auscultation bilaterally. GI: Soft, nontender, non-distended  MS: No edema; No deformity. Neuro:  Nonfocal  Psych: Normal affect  Extremities: Large area of ecchymosis in right groin puncture site without significant hematoma.  Large area of scrotal ecchymosis as well which is  somewhat sensitive  Labs    High Sensitivity Troponin:   Recent Labs  Lab 09/16/18 1642 09/16/18 1914 09/16/18 2104  TROPONINIHS 157* 2,919* 5,321*      Cardiac EnzymesNo results for input(s): TROPONINI in the last 168 hours. No results for input(s): TROPIPOC in the last 168 hours.   Chemistry Recent Labs  Lab 09/16/18 1642 09/17/18 0227  NA 138 137  K 3.8 3.7  CL 105 103  CO2 24 24  GLUCOSE 144* 139*  BUN 15 14  CREATININE 1.02 0.87  CALCIUM 9.1 8.9  PROT 6.8  --   ALBUMIN 4.1  --   AST 28  --   ALT 31  --   ALKPHOS 72  --   BILITOT 0.5  --   GFRNONAA >60 >60  GFRAA >60 >60  ANIONGAP 9 10     Hematology Recent Labs  Lab 09/16/18 1642 09/17/18 0227  WBC 7.2 9.8  RBC 4.89 4.50  HGB 15.9 14.7  HCT 46.1 42.7  MCV 94.3 94.9  MCH 32.5 32.7  MCHC 34.5 34.4  RDW 12.6 12.6  PLT 187 166    BNPNo results for input(s): BNP, PROBNP in the last 168 hours.   DDimer No results for input(s): DDIMER in the last 168 hours.   Radiology    No results found.  Cardiac Studies  Cardiac catheterization/PCI and stent (09/16/2018)  Conclusion  1.  Acute inferoposterior MI secondary to subtotal occlusion of the RCA, treated successfully with aspiration thrombectomy, PTCA, and drug-eluting stent implantation 2.  Severe residual stenosis involving the LAD/first diagonal 3.  Patent left main and left circumflex without high-grade obstruction 4.  Mild to moderate segmental LV systolic dysfunction with akinesis of the inferior wall and estimated LVEF of 45%  Recommendations: Staged PCI of the LAD/diagonal during the patient's index hospitalization.  CCU care and close monitoring of the patient's groin hematoma.  Type and screen is done.  Patient is hemodynamically stable.     Intervention      Patient Profile     68 y.o. moderately overweight married Caucasian male who lives in [place] had new onset chest pain yesterday at 1 PM.  He went to local  fire station where he was found to have an inferior posterior STEMI and he was transported urgently to the Cath Lab where Dr. Burt Knack performed PCI and drug-eluting stenting via the right femoral approach.  It was residual LAD/diagonal branch bifurcation disease.  He does have a history of hyperlipidemia intolerant to statin therapy.  He will be scheduled for staged LAD PCI tomorrow.  Assessment & Plan    1: Inferoposterior MI-postop day 1 inferoposterior STEMI with PCI and drug-eluting of a thrombotic mid dominant RCA.  Patient had aspiration thrombectomy and was on Aggrastat briefly.  Dr. Burt Knack was unable to thread a wire in the right radial artery despite 2 attempts and defaulted to femoral approach.  He did Perclose this but there was a significant hematoma requiring a FemoStop with residual ecchymosis extending to the scrotum.  The patient is on low-dose aspirin and ticagrelor.  He has severe inferior hypokinesia/akinesia..  Plan staged LAD/diagonal branch PCI and stenting tomorrow with Dr. Burt Knack.  2: Hyperlipidemia- total cholesterol 176 with an LDL of 101.  He has tried several statins in the past with side effects.  He would be a good candidate for PCSK9.  Will refer to the lipid clinic at discharge.  Out of bed to chair today.  Will write pre-PCI orders for staged intervention tomorrow  For questions or updates, please contact [place] Please consult [email or web address] for contact info under        Signed, Quay Burow, MD  09/17/2018, 8:28 AM

## 2018-09-17 NOTE — Progress Notes (Signed)
  Echocardiogram 2D Echocardiogram has been performed.  Eric Pace 09/17/2018, 11:26 AM

## 2018-09-17 NOTE — Care Management (Signed)
Brilinta benefits check sent and pending.  Jo-Anne Kluth RN, BSN, NCM-BC, ACM-RN [phone] 

## 2018-09-17 NOTE — Progress Notes (Signed)
[id number] Waited for 2D ECHO to be finished so I could begin ed. MI education completed except for ex ed. Will follow up and walk pt and finish ed at another visit. Discussed NTG use,MI restrictions, importance of brilinta, heart healthy and low carb food choices, and CRP 2. Referred to [place] CRP 2. Understanding voiced.  Pt is interested in participating in Virtual Cardiac Rehab. Pt advised that Virtual Cardiac Rehab is provided at no cost to the patient.  Checklist:  1. Pt has smart device  ie smartphone and/or ipad for downloading an app  Yes 2. Reliable internet/wifi service    Yes 3. Understands how to use their smartphone and navigate within an app.  Yes   Reviewed with pt the scheduling process for virtual cardiac rehab.  Pt verbalized understanding.

## 2018-09-17 NOTE — Care Management (Signed)
Per [facility] pharmacy co-pay for Brilinta twice a day $45.00 for a 30 day supply, 60 for 30. [facility] drug,No deductible,No PA required,  Pharmacy: Any Pharmacy pt. choose to use.

## 2018-09-17 NOTE — H&P (View-Only) (Signed)
Progress Note  Patient Name: Eric Pace Date of Encounter: 09/17/2018  Primary Cardiologist: Dr. Sherren Mocha  Subjective   Postop day #1 inferoposterior STEMI treated with PCI and drug-eluting stenting of a dominant RCA with residual LAD/diagonal branch bifurcation disease.  He feels clinically improved denies chest pain.  Did have some shortness of breath this morning potentially related to Brilinta.  He has a large area of ecchymosis in his groin extending to his scrotum.  Inpatient Medications    Scheduled Meds: . aspirin EC  81 mg Oral Daily  . metoprolol tartrate  25 mg Oral BID  . sodium chloride flush  3 mL Intravenous Q12H  . ticagrelor  90 mg Oral BID   Continuous Infusions: . sodium chloride     PRN Meds: sodium chloride, acetaminophen, morphine injection, nitroGLYCERIN, ondansetron (ZOFRAN) IV, oxyCODONE, sodium chloride flush   Vital Signs    Vitals:   09/17/18 0500 09/17/18 0600 09/17/18 0700 09/17/18 0816  BP: 139/85 (!) 160/93 140/84   Pulse: 65 83 73   Resp: 14 17 18    Temp:    98.6 F (37 C)  TempSrc:    Oral  SpO2: 99% 99% 99%   Weight:        Intake/Output Summary (Last 24 hours) at 09/17/2018 0828 Last data filed at 09/17/2018 0816 Gross per 24 hour  Intake 1100 ml  Output 725 ml  Net 375 ml   Last 3 Weights 09/16/2018  Weight (lbs) 225 lb 8.5 oz  Weight (kg) 102.3 kg      Telemetry    Sinus rhythm- Personally Reviewed  ECG    Normal sinus rhythm at 72 with inferior Q waves and improved lateral T wave inversion- Personally Reviewed  Physical Exam   GEN: No acute distress.   Neck: No JVD Cardiac: RRR, no murmurs, rubs, or gallops.  Respiratory: Clear to auscultation bilaterally. GI: Soft, nontender, non-distended  MS: No edema; No deformity. Neuro:  Nonfocal  Psych: Normal affect  Extremities: Large area of ecchymosis in right groin puncture site without significant hematoma.  Large area of scrotal ecchymosis as well which is  somewhat sensitive  Labs    High Sensitivity Troponin:   Recent Labs  Lab 09/16/18 1642 09/16/18 1914 09/16/18 2104  TROPONINIHS 157* 2,919* 5,321*      Cardiac EnzymesNo results for input(s): TROPONINI in the last 168 hours. No results for input(s): TROPIPOC in the last 168 hours.   Chemistry Recent Labs  Lab 09/16/18 1642 09/17/18 0227  NA 138 137  K 3.8 3.7  CL 105 103  CO2 24 24  GLUCOSE 144* 139*  BUN 15 14  CREATININE 1.02 0.87  CALCIUM 9.1 8.9  PROT 6.8  --   ALBUMIN 4.1  --   AST 28  --   ALT 31  --   ALKPHOS 72  --   BILITOT 0.5  --   GFRNONAA >60 >60  GFRAA >60 >60  ANIONGAP 9 10     Hematology Recent Labs  Lab 09/16/18 1642 09/17/18 0227  WBC 7.2 9.8  RBC 4.89 4.50  HGB 15.9 14.7  HCT 46.1 42.7  MCV 94.3 94.9  MCH 32.5 32.7  MCHC 34.5 34.4  RDW 12.6 12.6  PLT 187 166    BNPNo results for input(s): BNP, PROBNP in the last 168 hours.   DDimer No results for input(s): DDIMER in the last 168 hours.   Radiology    No results found.  Cardiac Studies  Cardiac catheterization/PCI and stent (09/16/2018)  Conclusion  1.  Acute inferoposterior MI secondary to subtotal occlusion of the RCA, treated successfully with aspiration thrombectomy, PTCA, and drug-eluting stent implantation 2.  Severe residual stenosis involving the LAD/first diagonal 3.  Patent left main and left circumflex without high-grade obstruction 4.  Mild to moderate segmental LV systolic dysfunction with akinesis of the inferior wall and estimated LVEF of 45%  Recommendations: Staged PCI of the LAD/diagonal during the patient's index hospitalization.  CCU care and close monitoring of the patient's groin hematoma.  Type and screen is done.  Patient is hemodynamically stable.     Intervention      Patient Profile     68 y.o. moderately overweight married Caucasian male who lives in [place] had new onset chest pain yesterday at 1 PM.  He went to local  fire station where he was found to have an inferior posterior STEMI and he was transported urgently to the Cath Lab where Dr. Burt Knack performed PCI and drug-eluting stenting via the right femoral approach.  It was residual LAD/diagonal branch bifurcation disease.  He does have a history of hyperlipidemia intolerant to statin therapy.  He will be scheduled for staged LAD PCI tomorrow.  Assessment & Plan    1: Inferoposterior MI-postop day 1 inferoposterior STEMI with PCI and drug-eluting of a thrombotic mid dominant RCA.  Patient had aspiration thrombectomy and was on Aggrastat briefly.  Dr. Burt Knack was unable to thread a wire in the right radial artery despite 2 attempts and defaulted to femoral approach.  He did Perclose this but there was a significant hematoma requiring a FemoStop with residual ecchymosis extending to the scrotum.  The patient is on low-dose aspirin and ticagrelor.  He has severe inferior hypokinesia/akinesia..  Plan staged LAD/diagonal branch PCI and stenting tomorrow with Dr. Burt Knack.  2: Hyperlipidemia- total cholesterol 176 with an LDL of 101.  He has tried several statins in the past with side effects.  He would be a good candidate for PCSK9.  Will refer to the lipid clinic at discharge.  Out of bed to chair today.  Will write pre-PCI orders for staged intervention tomorrow  For questions or updates, please contact [place] Please consult [email or web address] for contact info under        Signed, Quay Burow, MD  09/17/2018, 8:28 AM

## 2018-09-18 ENCOUNTER — Encounter (HOSPITAL_COMMUNITY): Payer: Self-pay | Admitting: Cardiovascular Disease

## 2018-09-18 ENCOUNTER — Encounter (HOSPITAL_COMMUNITY)
Admission: EM | Disposition: A | Discharge status: Home / Self Care | Payer: Self-pay | Source: Home / Self Care | Attending: Cardiovascular Disease

## 2018-09-18 HISTORY — PX: CORONARY STENT INTERVENTION: CATH118234

## 2018-09-18 LAB — HIV ANTIBODY (ROUTINE TESTING W REFLEX): HIV Screen 4th Generation wRfx: NONREACTIVE

## 2018-09-18 SURGERY — CORONARY STENT INTERVENTION
Anesthesia: LOCAL

## 2018-09-18 MED ORDER — HYDRALAZINE HCL 20 MG/ML IJ SOLN: 10.0000 mg | INTRAMUSCULAR | Status: AC | PRN

## 2018-09-18 MED ORDER — BIVALIRUDIN TRIFLUOROACETATE 250 MG IV SOLR
INTRAVENOUS | Status: AC
  Filled 2018-09-18: qty 250

## 2018-09-18 MED ORDER — MIDAZOLAM HCL 2 MG/2ML IJ SOLN
INTRAMUSCULAR | Status: AC
  Filled 2018-09-18: qty 2

## 2018-09-18 MED ORDER — SODIUM CHLORIDE 0.9 % WEIGHT BASED INFUSION
1.0000 mL/kg/h | INTRAVENOUS | Status: AC
  Administered 2018-09-18 (×2): 1 mL/kg/h via INTRAVENOUS

## 2018-09-18 MED ORDER — BIVALIRUDIN BOLUS VIA INFUSION - CUPID
INTRAVENOUS | Status: DC | PRN
  Administered 2018-09-18: 09:00:00 76.725 mg via INTRAVENOUS

## 2018-09-18 MED ORDER — NITROGLYCERIN 1 MG/10 ML FOR IR/CATH LAB
INTRA_ARTERIAL | Status: AC
  Filled 2018-09-18: qty 10

## 2018-09-18 MED ORDER — HEPARIN (PORCINE) IN NACL 1000-0.9 UT/500ML-% IV SOLN
INTRAVENOUS | Status: AC
  Filled 2018-09-18: qty 1000

## 2018-09-18 MED ORDER — VERAPAMIL HCL 2.5 MG/ML IV SOLN
INTRAVENOUS | Status: AC
  Filled 2018-09-18: qty 2

## 2018-09-18 MED ORDER — FENTANYL CITRATE (PF) 100 MCG/2ML IJ SOLN
INTRAMUSCULAR | Status: DC | PRN
  Administered 2018-09-18 (×2): 25 ug via INTRAVENOUS

## 2018-09-18 MED ORDER — LABETALOL HCL 5 MG/ML IV SOLN: 10.0000 mg | INTRAVENOUS | Status: AC | PRN

## 2018-09-18 MED ORDER — LIDOCAINE HCL (PF) 1 % IJ SOLN
INTRAMUSCULAR | Status: AC
  Filled 2018-09-18: qty 30

## 2018-09-18 MED ORDER — FENTANYL CITRATE (PF) 100 MCG/2ML IJ SOLN
INTRAMUSCULAR | Status: AC
  Filled 2018-09-18: qty 2

## 2018-09-18 MED ORDER — NITROGLYCERIN 1 MG/10 ML FOR IR/CATH LAB
INTRA_ARTERIAL | Status: DC | PRN
  Administered 2018-09-18 (×3): 150 ug via INTRACORONARY

## 2018-09-18 MED ORDER — HEPARIN (PORCINE) IN NACL 1000-0.9 UT/500ML-% IV SOLN
INTRAVENOUS | Status: DC | PRN
  Administered 2018-09-18 (×2): 500 mL

## 2018-09-18 MED ORDER — MIDAZOLAM HCL 2 MG/2ML IJ SOLN
INTRAMUSCULAR | Status: DC | PRN
  Administered 2018-09-18: 1 mg via INTRAVENOUS
  Administered 2018-09-18: 2 mg via INTRAVENOUS

## 2018-09-18 MED ORDER — SODIUM CHLORIDE 0.9% FLUSH: 3.0000 mL | INTRAVENOUS | Status: DC | PRN

## 2018-09-18 MED ORDER — IOHEXOL 350 MG/ML SOLN
INTRAVENOUS | Status: DC | PRN
  Administered 2018-09-18: 09:00:00 90 mL via INTRA_ARTERIAL

## 2018-09-18 MED ORDER — SODIUM CHLORIDE 0.9 % IV SOLN: 250.0000 mL | INTRAVENOUS | Status: DC | PRN

## 2018-09-18 MED ORDER — SODIUM CHLORIDE 0.9 % IV SOLN
INTRAVENOUS | Status: DC | PRN
  Administered 2018-09-18: 09:00:00 1.75 mg/kg/h via INTRAVENOUS
  Administered 2018-09-18: 09:00:00

## 2018-09-18 MED ORDER — SODIUM CHLORIDE 0.9% FLUSH
3.0000 mL | Freq: Two times a day (BID) | INTRAVENOUS | Status: DC
  Administered 2018-09-19: 3 mL via INTRAVENOUS

## 2018-09-18 MED ORDER — LIDOCAINE HCL (PF) 1 % IJ SOLN
INTRAMUSCULAR | Status: DC | PRN
  Administered 2018-09-18 (×2): 2 mL

## 2018-09-18 MED ORDER — VERAPAMIL HCL 2.5 MG/ML IV SOLN
INTRAVENOUS | Status: DC | PRN
  Administered 2018-09-18: 10 mL via INTRA_ARTERIAL

## 2018-09-18 SURGICAL SUPPLY — 20 items
BALLN SAPPHIRE 2.5X12 (BALLOONS) ×2
BALLN SAPPHIRE ~~LOC~~ 2.5X8 (BALLOONS) ×1 IMPLANT
BALLN SAPPHIRE ~~LOC~~ 3.0X8 (BALLOONS) ×1 IMPLANT
BALLOON SAPPHIRE 2.5X12 (BALLOONS) IMPLANT
CATH LAUNCHER 6FR EBU3.5 (CATHETERS) ×1 IMPLANT
DEVICE RAD COMP TR BAND LRG (VASCULAR PRODUCTS) ×1 IMPLANT
GLIDESHEATH SLEND SS 6F .021 (SHEATH) ×1 IMPLANT
GUIDEWIRE INQWIRE 1.5J.035X260 (WIRE) IMPLANT
INQWIRE 1.5J .035X260CM (WIRE) ×2
KIT ENCORE 26 ADVANTAGE (KITS) ×1 IMPLANT
KIT HEART LEFT (KITS) ×2 IMPLANT
PACK CARDIAC CATHETERIZATION (CUSTOM PROCEDURE TRAY) ×2 IMPLANT
SHEATH PINNACLE 6F 10CM (SHEATH) ×1 IMPLANT
SHEATH PROBE COVER 6X72 (BAG) ×1 IMPLANT
STENT RESOLUTE ONYX 2.25X12 (Permanent Stent) ×1 IMPLANT
STENT RESOLUTE ONYX 2.75X12 (Permanent Stent) ×1 IMPLANT
TRANSDUCER W/STOPCOCK (MISCELLANEOUS) ×2 IMPLANT
TUBING CIL FLEX 10 FLL-RA (TUBING) ×2 IMPLANT
WIRE COUGAR XT STRL 190CM (WIRE) ×2 IMPLANT
WIRE EMERALD 3MM-J .035X150CM (WIRE) ×1 IMPLANT

## 2018-09-18 NOTE — Interval H&P Note (Signed)
History and Physical Interval Note:  09/18/2018 8:15 AM  Eric Pace  has presented today for surgery, with the diagnosis of cad.  The various methods of treatment have been discussed with the patient and family. After consideration of risks, benefits and other options for treatment, the patient has consented to  Procedure(s): CORONARY STENT INTERVENTION (N/A) as a surgical intervention.  The patient's history has been reviewed, patient examined, no change in status, stable for surgery.  I have reviewed the patient's chart and labs.  Questions were answered to the patient's satisfaction.     Sherren Mocha

## 2018-09-18 NOTE — Progress Notes (Signed)
TR BAND REMOVAL  LOCATION:  left radial  DEFLATED PER PROTOCOL:  Yes.    TIME BAND OFF / DRESSING APPLIED:   1345   SITE UPON ARRIVAL:   Level 0  SITE AFTER BAND REMOVAL:  Level 0  CIRCULATION SENSATION AND MOVEMENT:  Within Normal Limits  Yes.    COMMENTS:

## 2018-09-19 DIAGNOSIS — I255 Ischemic cardiomyopathy: Secondary | ICD-10-CM

## 2018-09-19 DIAGNOSIS — E782 Mixed hyperlipidemia: Secondary | ICD-10-CM

## 2018-09-19 LAB — BASIC METABOLIC PANEL
Anion gap: 9 (ref 5–15)
BUN: 11 mg/dL (ref 8–23)
CO2: 22 mmol/L (ref 22–32)
Calcium: 8.8 mg/dL — ABNORMAL LOW (ref 8.9–10.3)
Chloride: 104 mmol/L (ref 98–111)
Creatinine, Ser: 0.82 mg/dL (ref 0.61–1.24)
GFR calc Af Amer: >60 mL/min (ref >60)
GFR calc non Af Amer: >60 mL/min (ref >60)
Glucose, Bld: 119 mg/dL — ABNORMAL HIGH (ref 70–99)
Potassium: 3.9 mmol/L (ref 3.5–5.1)
Sodium: 135 mmol/L (ref 135–145)

## 2018-09-19 LAB — CBC
HCT: 41 % (ref 39.0–52.0)
Hemoglobin: 13.8 g/dL (ref 13.0–17.0)
MCH: 32.1 pg (ref 26.0–34.0)
MCHC: 33.7 g/dL (ref 30.0–36.0)
MCV: 95.3 fL (ref 80.0–100.0)
Platelets: 186 10*3/uL (ref 150–400)
RBC: 4.3 MIL/uL (ref 4.22–5.81)
RDW: 12.8 % (ref 11.5–15.5)
WBC: 8.7 10*3/uL (ref 4.0–10.5)
nRBC: 0 % (ref 0.0–0.2)

## 2018-09-19 LAB — POCT ACTIVATED CLOTTING TIME: Activated Clotting Time: 494 seconds

## 2018-09-19 MED ORDER — METOPROLOL SUCCINATE ER 25 MG PO TB24
25.0000 mg | ORAL_TABLET | Freq: Every day | ORAL | Status: DC
  Administered 2018-09-19: 25 mg via ORAL
  Filled 2018-09-19: qty 1

## 2018-09-19 MED ORDER — ASPIRIN 81 MG PO TBEC: 81.0000 mg | DELAYED_RELEASE_TABLET | Freq: Every day | ORAL | 3 refills | Status: AC

## 2018-09-19 MED ORDER — METOPROLOL SUCCINATE ER 25 MG PO TB24: 25.0000 mg | ORAL_TABLET | Freq: Every day | ORAL | 6 refills | Status: DC

## 2018-09-19 MED ORDER — EZETIMIBE 10 MG PO TABS: 10.0000 mg | ORAL_TABLET | Freq: Every day | ORAL | 6 refills | Status: DC

## 2018-09-19 MED ORDER — NITROGLYCERIN 0.4 MG SL SUBL: 0.4000 mg | SUBLINGUAL_TABLET | SUBLINGUAL | 12 refills | Status: DC | PRN

## 2018-09-19 MED ORDER — LISINOPRIL 2.5 MG PO TABS: 2.5000 mg | ORAL_TABLET | Freq: Every day | ORAL | 6 refills | Status: DC

## 2018-09-19 MED ORDER — LISINOPRIL 2.5 MG PO TABS
2.5000 mg | ORAL_TABLET | Freq: Every day | ORAL | Status: DC
  Administered 2018-09-19: 2.5 mg via ORAL
  Filled 2018-09-19: qty 1

## 2018-09-19 MED ORDER — TICAGRELOR 90 MG PO TABS: 90.0000 mg | ORAL_TABLET | Freq: Two times a day (BID) | ORAL | 11 refills | Status: DC

## 2018-09-19 MED FILL — BRILINTA 90 MG TABLET: 90 | 30 days supply | Qty: 60 | Fill #0 | Status: TO

## 2018-09-19 MED FILL — EZETIMIBE 10 MG TABS: 10 | 30 days supply | Qty: 30 | Fill #0

## 2018-09-19 MED FILL — NITROGLYCERIN 0.4 MG TAB SL: 0.4 | 14 days supply | Qty: 25 | Fill #0

## 2018-09-19 MED FILL — ASPIRIN LOW DOSE 81 MG TBEC: 81 | 90 days supply | Qty: 90 | Fill #0

## 2018-09-19 MED FILL — METOPROLOL SUCCINATE ER 25: 25 | 30 days supply | Qty: 30 | Fill #0 | Status: TO

## 2018-09-19 MED FILL — LISINOPRIL 2.5 MG TABLET: 2.5 | 30 days supply | Qty: 30 | Fill #0

## 2018-09-19 MED FILL — METOPROLOL SUCCINATE ER 25: 25 | 30 days supply | Qty: 30 | Fill #0

## 2018-09-19 MED FILL — NITROGLYCERIN 0.4 MG TAB SL: 0.4 | 14 days supply | Qty: 25 | Fill #0 | Status: TO

## 2018-09-19 MED FILL — EZETIMIBE 10 MG TABS: 10 | 30 days supply | Qty: 30 | Fill #0 | Status: TO

## 2018-09-19 MED FILL — ASPIRIN LOW DOSE 81 MG TBEC: 81 | 90 days supply | Qty: 90 | Fill #0 | Status: TO

## 2018-09-19 MED FILL — LISINOPRIL 2.5 MG TABLET: 2.5 | 30 days supply | Qty: 30 | Fill #0 | Status: TO

## 2018-09-19 MED FILL — BRILINTA 90 MG TABLET: 90 | 30 days supply | Qty: 60 | Fill #0

## 2018-09-19 NOTE — Plan of Care (Signed)

## 2018-09-19 NOTE — Progress Notes (Signed)
CARDIAC REHAB PHASE I   PRE:  Rate/Rhythm: off telemetry   Put back on  102 ST  MODE:  Ambulation: 470 ft   POST:  Rate/Rhythm: 102 ST  BP:  Supine:   Sitting: 134/71  Standing:    SaO2: 98%RA 0920-0952 Pt walked 470 ft on RA with steady gait and no CP. Tolerated well. Reinforced importance of brilinta. Reviewed ex ed. Pt given walking instructions. Has been referred to [facility] CRP 2. Pt voiced understanding of ed.   Graylon Good, RN BSN  09/19/2018 9:49 AM

## 2018-09-19 NOTE — Plan of Care (Signed)
  Problem: Education: Goal: Understanding of CV disease, CV risk reduction, and recovery process will improve Outcome: Progressing   Problem: Activity: Goal: Ability to return to baseline activity level will improve Outcome: Progressing   Problem: Cardiovascular: Goal: Ability to achieve and maintain adequate cardiovascular perfusion will improve Outcome: Progressing Goal: Vascular access site(s) Level 0-1 will be maintained Outcome: Progressing  Rt radial level 0. Rt femoral level 1.

## 2018-09-19 NOTE — Care Management Important Message (Signed)
Important Message  Patient Details  Name: Eric Pace MRN: [id number] Date of Birth: 1950-07-30   Medicare Important Message Given:  Yes     Shelda Altes 09/19/2018, 11:48 AM

## 2018-09-19 NOTE — Discharge Summary (Addendum)
Discharge Summary    Patient ID: Eric Pace MRN: [id number]; DOB: 1950-12-22  Admit date: 09/16/2018 Discharge date: 09/19/2018  Primary Care Provider: [name], MD  Primary Cardiologist: Sherren Mocha, MD   Discharge Diagnoses    Active Problems:   Inferoposterior myocardial infarction [facility])   STEMI involving right coronary artery [facility])   CAD   Acute systolic CHF   Hyperlipidemia   R groin ecchymosis  Allergies Allergies  Allergen Reactions   Statins Swelling    Side of face swelled / numb and caused numbness off and on    Diagnostic Studies/Procedures    CORONARY STENT INTERVENTION 09/18/2018  Conclusion  Successful PCI of the LAD and first diagonal with stenting of both vessels followed by PTCA of the diagonal ostium through the LAD stent struts  Recommendations: Overnight observation, anticipate discharge tomorrow, dual antiplatelet therapy with aspirin and ticagrelor at least 12 months, aggressive risk reduction measures.  Diagnostic Dominance: Right  Intervention      Echo 09/17/2018 IMPRESSIONS    1. The left ventricle has mild-moderately reduced systolic function, with an ejection fraction of 40-45%. The cavity size was normal. There is moderate concentric left ventricular hypertrophy. Left ventricular diastolic Doppler parameters are  indeterminate.  2. Mild hypokinesis of the left ventricular, entire inferoseptal wall, inferior wall and inferolateral wall.  3. The right ventricle has normal systolic function. The cavity was normal. There is no increase in right ventricular wall thickness. Right ventricular systolic pressure could not be assessed.  4. No evidence of mitral valve stenosis.  5. The aortic valve is tricuspid. Mild calcification of the aortic valve. No stenosis of the aortic valve.  6. The aortic arch and aortic root are normal in size and structure.  7. The interatrial septum was not well visualized.   Coronary/Graft Acute  MI Revascularization  09/16/2018  CORONARY STENT INTERVENTION  LEFT HEART CATH AND CORONARY ANGIOGRAPHY  Conclusion  1.  Acute inferoposterior MI secondary to subtotal occlusion of the RCA, treated successfully with aspiration thrombectomy, PTCA, and drug-eluting stent implantation 2.  Severe residual stenosis involving the LAD/first diagonal 3.  Patent left main and left circumflex without high-grade obstruction 4.  Mild to moderate segmental LV systolic dysfunction with akinesis of the inferior wall and estimated LVEF of 45%  Recommendations: Staged PCI of the LAD/diagonal during the patient's index hospitalization.  CCU care and close monitoring of the patient's groin hematoma.  Type and screen is done.  Patient is hemodynamically     History of Present Illness     Eric Pace is a 68 y.o. male with no significant past medical history who presents with acute onset chest pain and is diagnosed as a code STEMI from the field, brought directly to the cardiac catheterization lab.  No significant medical problems.  He only takes eyedrops for medication.  Stated that he has had "borderline high blood pressure".  He has been statin intolerant. He was in his normal state of health until  when he developed abrupt onset of substernal chest pain radiating to both shoulders, characterized as a pressure-like sensation.  There was associated nausea but no vomiting.  There was associated diaphoresis.  He denied shortness of breath, orthopnea, or PND. He denied lightheadedness or syncope.  He was driven by a friend towards the hospital, but decided to stop at the fire station because he was feeling so poorly. He was picked up by EMS at the fire station and an EKG demonstrated an acute  inferior STEMI.  A code STEMI is called and he is brought directly to the cardiac catheterization lab after briefly stopping in the emergency department for a COVID-19 swab.  The patient has had no symptoms suggestive of  COVID-19, nor has he had any sick contacts  Hospital Course     Consultants: None  1.  Acute inferoposterior MI - Secondary to subtotal occlusion of the RCA, treated successfully with aspiration thrombectomy, PTCA, and drug-eluting stent implantation.   Dr. Burt Knack was unable to thread a wire in the right radial artery despite 2 attempts and defaulted to femoral approach.  He did Perclose this but there was a significant hematoma requiring a FemoStop with residual ecchymosis extending to the scrotum. He has severe inferior hypokinesia/akinesia. Her underwent staged successful PCI of the LAD and first diagonal with stenting of both vessels followed by PTCA of the diagonal ostium through the LAD stent struts. DAPT with ASA and Brillinta. Added statin.   2. Acute systolic CHF - Echo showed LVEF of 40-45% with WM abnormality. Euvolemic. Consolidate metoprolol to long acting. Add low dose lisinopril.   3. HLD - 09/17/2018: Cholesterol 176; HDL 31; LDL Cholesterol 101; Triglycerides 222; VLDL 44  - Statin intolerance. Added Zetia. Outpatient lipid clinic referral for PCSK9 inhibitor.   4. R groin ecchymosis  - Extending to scrotum. No pain.   The patient been seen by Dr. Gwenlyn Found today and deemed ready for discharge home. All follow-up appointments have been scheduled. Discharge medications are listed below.   Discharge Vitals Blood pressure 124/79, pulse 88, temperature 98.2 F (36.8 C), temperature source Oral, resp. rate 17, height 5\' 11"  (1.803 m), weight 100.2 kg, SpO2 99 %.  Filed Weights   09/16/18 1800 09/19/18 0452  Weight: 102.3 kg 100.2 kg   Physical Exam  Constitutional: He is oriented to person, place, and time. He appears well-developed.  HENT:  Head: Normocephalic and atraumatic.  Eyes: Pupils are equal, round, and reactive to light. EOM are normal.  Neck: Normal range of motion. Neck supple.  Cardiovascular: Normal rate and regular rhythm.  Large R groin ecchymosis extending to  Scrotum   Pulmonary/Chest: Effort normal and breath sounds normal.  Abdominal: Soft. Bowel sounds are normal.  Musculoskeletal: Normal range of motion.  Neurological: He is alert and oriented to person, place, and time.  Skin: Skin is warm and dry.  Psychiatric: He has a normal mood and affect.   Labs & Radiologic Studies    CBC Recent Labs    09/17/18 1401 09/19/18 0544  WBC 9.1 8.7  HGB 14.7 13.8  HCT 43.3 41.0  MCV 95.6 95.3  PLT 192 614   Basic Metabolic Panel Recent Labs    09/17/18 1345 09/19/18 0544  NA 135 135  K 3.9 3.9  CL 103 104  CO2 18* 22  GLUCOSE 129* 119*  BUN 13 11  CREATININE 1.02 0.82  CALCIUM 9.0 8.8*   Liver Function Tests Recent Labs    09/16/18 1642  AST 28  ALT 31  ALKPHOS 72  BILITOT 0.5  PROT 6.8  ALBUMIN 4.1   Hemoglobin A1C Recent Labs    09/16/18 1914  HGBA1C 6.3*   Fasting Lipid Panel Recent Labs    09/17/18 0227  CHOL 176  HDL 31*  LDLCALC 101*  TRIG 222*  CHOLHDL 5.7    Disposition   Pt is being discharged home today in good condition.  Follow-up Plans & Appointments    Follow-up Information  Leanor Kail, PA. Go on 09/26/2018.   Specialty: Cardiology Why: @3 :15pm for hospital follow up  Contact information: [place] [address] [phone]          Discharge Instructions    Amb Referral to Cardiac Rehabilitation   Complete by: As directed    Diagnosis:  STEMI Coronary Stents     After initial evaluation and assessments completed: Virtual Based Care may be provided alone or in conjunction with Phase 2 Cardiac Rehab based on patient barriers.: Yes   Diet - low sodium heart healthy   Complete by: As directed    Discharge instructions   Complete by: As directed    No driving for 2 weeks. No lifting over 10 lbs for 4 weeks. No sexual activity for 4 weeks. You may not return to work until cleared by your cardiologist. Keep procedure site clean & dry. If you  notice increased pain, swelling, bleeding or pus, call/return!  You may shower, but no soaking baths/hot tubs/pools for 1 week.   Increase activity slowly   Complete by: As directed       Discharge Medications   Allergies as of 09/19/2018      Reactions   Statins Swelling   Side of face swelled / numb and caused numbness off and on      Medication List    STOP taking these medications   aspirin 500 MG tablet Replaced by: aspirin 81 MG EC tablet   BC HEADACHE POWDER PO     TAKE these medications   aspirin 81 MG EC tablet Take 1 tablet (81 mg total) by mouth daily. Replaces: aspirin 500 MG tablet   cholecalciferol 25 MCG (1000 UT) tablet Commonly known as: VITAMIN D3 Take 3,000 Units by mouth daily.   ezetimibe 10 MG tablet Commonly known as: ZETIA Take 1 tablet (10 mg total) by mouth daily.   lisinopril 2.5 MG tablet Commonly known as: ZESTRIL Take 1 tablet (2.5 mg total) by mouth daily.   metoprolol succinate 25 MG 24 hr tablet Commonly known as: TOPROL-XL Take 1 tablet (25 mg total) by mouth daily.   nitroGLYCERIN 0.4 MG SL tablet Commonly known as: NITROSTAT Place 1 tablet (0.4 mg total) under the tongue every 5 (five) minutes x 3 doses as needed for chest pain.   oxymetazoline 0.05 % nasal spray Commonly known as: AFRIN Place 1 spray into both nostrils 2 (two) times daily as needed for congestion.   PreserVision AREDS 2 Caps Take 2 capsules by mouth daily.   ticagrelor 90 MG Tabs tablet Commonly known as: BRILINTA Take 1 tablet (90 mg total) by mouth 2 (two) times daily.   TUMS PO Take 3 tablets by mouth once.        Acute coronary syndrome (MI, NSTEMI, STEMI, etc) this admission?: Yes.     AHA/ACC Clinical Performance & Quality Measures: 1. Aspirin prescribed? - Yes 2. ADP Receptor Inhibitor (Plavix/Clopidogrel, Brilinta/Ticagrelor or Effient/Prasugrel) prescribed (includes medically managed patients)? - Yes 3. Beta Blocker prescribed? -  Yes 4. High Intensity Statin (Lipitor 40-80mg  or Crestor 20-40mg ) prescribed? - No. Statin intolerance  5. EF assessed during THIS hospitalization? - Yes 6. For EF <40%, was ACEI/ARB prescribed? - Yes 7. For EF <40%, Aldosterone Antagonist (Spironolactone or Eplerenone) prescribed? - Not Applicable (EF >/= 02%) 8. Cardiac Rehab Phase II ordered (Included Medically managed Patients)? - Yes     Outstanding Labs/Studies   Lipid panel and LFTs after lipid clinic evaluation  Duration of Discharge Encounter   Greater than 30 minutes including physician time.  Signed, Leanor Kail, PA 09/19/2018, 9:13 AM   Agree with note by Robbie Lis PA-C  Eric Pace was admitted with an inferior STEMI and underwent PCI and drug-eluting stenting with aspiration thrombectomy of an occluded RCA with an excellent result.  His initial procedure was done via the right femoral approach by Dr. Burt Knack who used Perclose to seal the groin.  Unfortunately, the patient did develop severe ecchymosis involving his right groin and scrotum.  He did have mild to moderate LV dysfunction.  He had staged proximal LAD diagonal branch bifurcation intervention by Dr. Burt Knack yesterday via the left radial approach with stenting of the diagonal branch and bifurcation stenting of the LAD and diagonal branch.  He had an excellent angiographic result.  He is asymptomatic this morning and stable for discharge home on aspirin and Brilinta.  He will follow-up with Vin Bhagat  PA-C in 7 to 10 days and with Dr. Burt Knack thereafter.  He will be on DAPT for 12 months uninterrupted as well as high-dose statin therapy, beta-blocker and ACE inhibitor for his LV dysfunction.  Lorretta Harp, M.D., [place], [facility], [place] [address]. [place], [place]  [address]  [phone] 09/19/2018 9:56 AM

## 2018-09-25 ENCOUNTER — Telehealth: Payer: Self-pay | Admitting: Physician Assistant

## 2018-09-25 NOTE — Telephone Encounter (Signed)

## 2018-09-26 ENCOUNTER — Encounter: Payer: Self-pay | Admitting: Physician Assistant

## 2018-09-26 ENCOUNTER — Other Ambulatory Visit: Payer: Self-pay

## 2018-09-26 ENCOUNTER — Ambulatory Visit: Payer: PPO | Admitting: Physician Assistant

## 2018-09-26 VITALS — BP 118/68 | HR 78 | Ht 71.0 in | Wt 217.8 lb

## 2018-09-26 DIAGNOSIS — I25110 Atherosclerotic heart disease of native coronary artery with unstable angina pectoris: Secondary | ICD-10-CM

## 2018-09-26 DIAGNOSIS — I2119 ST elevation (STEMI) myocardial infarction involving other coronary artery of inferior wall: Secondary | ICD-10-CM | POA: Diagnosis not present

## 2018-09-26 DIAGNOSIS — I2111 ST elevation (STEMI) myocardial infarction involving right coronary artery: Secondary | ICD-10-CM | POA: Diagnosis not present

## 2018-09-26 DIAGNOSIS — E785 Hyperlipidemia, unspecified: Secondary | ICD-10-CM

## 2018-09-26 DIAGNOSIS — I10 Essential (primary) hypertension: Secondary | ICD-10-CM | POA: Diagnosis not present

## 2018-09-26 NOTE — Progress Notes (Signed)
Cardiology Office Note    Date:  09/26/2018   ID:  Eric Pace, DOB 10-28-1950, MRN [id number]  PCP:  [name], MD  Cardiologist:  Dr. Burt Knack   Chief Complaint: Hospital follow up for STEMI  History of Present Illness:   Eric Pace is a 68 y.o. male with recent admission for STEMI presents for follow up.   No significant medical problems prior to admission. He only takes eyedrops for medication. Presented with acute inferoposterior MI secondary to subtotal occlusion of the RCA, treated successfully with aspiration thrombectomy, PTCA, and drug-eluting stent implantation. Dr. Burt Knack was unable to thread a wire in the right radial artery despite 2 attempts and defaulted to femoral approach. He did Perclose this but there was a significant hematoma requiring a FemoStop with residual ecchymosis extending to the scrotum. He has severe inferior hypokinesia/akinesia. Her underwent staged successful PCI of the LAD and first diagonal with stenting of both vessels followed by PTCA of the diagonal ostium through the LAD stent struts. Echo showed LVEF of 40-45% with WM abnormality. Treated with BB and lisinopril. He is Statin intolerance. Added Zetia. Outpatient lipid clinic referral for PCSK9 inhibitor.   Here today for follow up.  He is walking 10 to 15 minutes multiple times a day without chest pain or shortness of breath.  He denies orthopnea, PND, syncope, lower extremity edema or melena.  Compliant with his medication.   Past Medical History:  Diagnosis Date   CAD (coronary artery disease)    a. cath 09/16/18 aspiration thrombectomy and DES to RCA b. cath 09/18/18  PCI of LAD adn 1st diag with stenting to both and PTCA of the diagonal ostium throught the LAD stent struts   Chronic systolic CHF (congestive heart failure) ([place])    Essential hypertension    Mixed hyperlipidemia     Past Surgical History:  Procedure Laterality Date   CORONARY STENT INTERVENTION N/A  09/16/2018   Procedure: CORONARY STENT INTERVENTION;  Surgeon: Sherren Mocha, MD;  Location: [place] [facility];  Service: Cardiovascular;  Laterality: N/A;   CORONARY STENT INTERVENTION N/A 09/18/2018   Procedure: CORONARY STENT INTERVENTION;  Surgeon: Sherren Mocha, MD;  Location: [place] [facility];  Service: Cardiovascular;  Laterality: N/A;   CORONARY/GRAFT ACUTE MI REVASCULARIZATION N/A 09/16/2018   Procedure: Coronary/Graft Acute MI Revascularization;  Surgeon: Sherren Mocha, MD;  Location: [place] [facility];  Service: Cardiovascular;  Laterality: N/A;   HEMORRHOIDECTOMY WITH HEMORRHOID BANDING     LEFT HEART CATH AND CORONARY ANGIOGRAPHY N/A 09/16/2018   Procedure: LEFT HEART CATH AND CORONARY ANGIOGRAPHY;  Surgeon: Sherren Mocha, MD;  Location: [place] [facility];  Service: Cardiovascular;  Laterality: N/A;    Current Medications: Prior to Admission medications   Medication Sig Start Date End Date Taking? Authorizing Provider  aspirin EC 81 MG EC tablet Take 1 tablet (81 mg total) by mouth daily. 09/19/18   Eulla Kochanowski, Crista Luria, PA  Calcium Carbonate Antacid (TUMS PO) Take 3 tablets by mouth once.    [provider]  cholecalciferol (VITAMIN D3) 25 MCG (1000 UT) tablet Take 3,000 Units by mouth daily.    [provider]  ezetimibe (ZETIA) 10 MG tablet Take 1 tablet (10 mg total) by mouth daily. 09/19/18   Ramonte Mena, Crista Luria, PA  lisinopril (ZESTRIL) 2.5 MG tablet Take 1 tablet (2.5 mg total) by mouth daily. 09/19/18   Leanor Kail, PA  metoprolol succinate (TOPROL-XL) 25 MG 24 hr tablet Take 1 tablet (25 mg  total) by mouth daily. 09/19/18   Leanor Kail, PA  Multiple Vitamins-Minerals (PRESERVISION AREDS 2) CAPS Take 2 capsules by mouth daily.    [provider]  nitroGLYCERIN (NITROSTAT) 0.4 MG SL tablet Place 1 tablet (0.4 mg total) under the tongue every 5 (five) minutes x 3 doses as needed for chest pain. 09/19/18   Wilmarie Sparlin, Crista Luria, PA    oxymetazoline (AFRIN) 0.05 % nasal spray Place 1 spray into both nostrils 2 (two) times daily as needed for congestion.    [provider]  ticagrelor (BRILINTA) 90 MG TABS tablet Take 1 tablet (90 mg total) by mouth 2 (two) times daily. 09/19/18   Leanor Kail, PA    Allergies:   Statins   Social History   Socioeconomic History   Marital status: Married    Spouse name: Not on file   Number of children: Not on file   Years of education: Not on file   Highest education level: Not on file  Occupational History   Not on file  Social Needs   Financial resource strain: Not on file   Food insecurity    Worry: Not on file    Inability: Not on file   Transportation needs    Medical: Not on file    Non-medical: Not on file  Tobacco Use   Smoking status: Never Smoker   Smokeless tobacco: Never Used  Substance and Sexual Activity   Alcohol use: Not Currently   Drug use: Never   Sexual activity: Not on file  Lifestyle   Physical activity    Days per week: Not on file    Minutes per session: Not on file   Stress: Not on file  Relationships   Social connections    Talks on phone: Not on file    Gets together: Not on file    Attends religious service: Not on file    Active member of club or organization: Not on file    Attends meetings of clubs or organizations: Not on file    Relationship status: Not on file  Other Topics Concern   Not on file  Social History Narrative   Not on file     Family History:  The patient's family history is not on file. He was adopted.   ROS:   Please see the history of present illness.    ROS All other systems reviewed and are negative.   PHYSICAL EXAM:   VS:  BP 118/68    Pulse 78    Ht 5\' 11"  (1.803 m)    Wt 217 lb 12.8 oz (98.8 kg)    SpO2 97%    BMI 30.38 kg/m    GEN: Well nourished, well developed, in no acute distress  HEENT: normal  Neck: no JVD, carotid bruits, or masses Cardiac: RRR; no murmurs,  rubs, or gallops,no edema, mild scrotum swelling with right groin hematoma Respiratory:  clear to auscultation bilaterally, normal work of breathing GI: soft, nontender, nondistended, + BS MS: no deformity or atrophy  Skin: warm and dry, no rash Neuro:  Alert and Oriented x 3, Strength and sensation are intact Psych: euthymic mood, full affect  Wt Readings from Last 3 Encounters:  09/26/18 217 lb 12.8 oz (98.8 kg)  09/19/18 220 lb 14.4 oz (100.2 kg)      Studies/Labs Reviewed:   EKG:  EKG is ordered today.  The ekg ordered today demonstrates sinus rhythm at rate of 78 bpm, T wave inversion in inferior lateral  leads  Recent Labs: 09/16/2018: ALT 31 09/19/2018: BUN 11; Creatinine, Ser 0.82; Hemoglobin 13.8; Platelets 186; Potassium 3.9; Sodium 135   Lipid Panel    Component Value Date/Time   CHOL 176 09/17/2018 0227   TRIG 222 (H) 09/17/2018 0227   HDL 31 (L) 09/17/2018 0227   CHOLHDL 5.7 09/17/2018 0227   VLDL 44 (H) 09/17/2018 0227   LDLCALC 101 (H) 09/17/2018 0227    Additional studies/ records that were reviewed today include:    CORONARY STENT INTERVENTION 09/18/2018  Conclusion  Successful PCI of the LAD and first diagonal with stenting of both vessels followed by PTCA of the diagonal ostium through the LAD stent struts  Recommendations: Overnight observation, anticipate discharge tomorrow, dual antiplatelet therapy with aspirin and ticagrelor at least 12 months, aggressive risk reduction measures.  Diagnostic Dominance: Right  Intervention      Echo 09/17/2018 IMPRESSIONS   1. The left ventricle has mild-moderately reduced systolic function, with an ejection fraction of 40-45%. The cavity size was normal. There is moderate concentric left ventricular hypertrophy. Left ventricular diastolic Doppler parameters are  indeterminate. 2. Mild hypokinesis of the left ventricular, entire inferoseptal wall, inferior wall and inferolateral wall. 3. The right  ventricle has normal systolic function. The cavity was normal. There is no increase in right ventricular wall thickness. Right ventricular systolic pressure could not be assessed. 4. No evidence of mitral valve stenosis. 5. The aortic valve is tricuspid. Mild calcification of the aortic valve. No stenosis of the aortic valve. 6. The aortic arch and aortic root are normal in size and structure. 7. The interatrial septum was not well visualized.   Coronary/Graft Acute MI Revascularization  09/16/2018  CORONARY STENT INTERVENTION  LEFT HEART CATH AND CORONARY ANGIOGRAPHY  Conclusion  1. Acute inferoposterior MI secondary to subtotal occlusion of the RCA, treated successfully with aspiration thrombectomy, PTCA, and drug-eluting stent implantation 2. Severe residual stenosis involving the LAD/first diagonal 3. Patent left main and left circumflex without high-grade obstruction 4. Mild to moderate segmental LV systolic dysfunction with akinesis of the inferior wall and estimated LVEF of 45%  Recommendations: Staged PCI of the LAD/diagonal during the patient's index hospitalization. CCU care and close monitoring of the patient's groin hematoma. Type and screen is done. Patient is hemodynamically       ASSESSMENT & PLAN:    1. CAD -No recurrent angina.  Gradually increase exercise.  Continue aspirin, Brilinta, Toprol-XL and lisinopril.  2.chronic systolic CHF -Euvolemic.  Continue current therapy.  3. HTN -Blood pressure stable on current medication.  4. HLD Statin intolerance.  Continue Zetia.  Refer to lipid clinic for PCSK9 inhibitor.   Medication Adjustments/Labs and Tests Ordered: Current medicines are reviewed at length with the patient today.  Concerns regarding medicines are outlined above.  Medication changes, Labs and Tests ordered today are listed in the Patient Instructions below. Patient Instructions  Medication Instructions:  Your physician recommends  that you continue on your current medications as directed. Please refer to the Current Medication list given to you today.  If you need a refill on your cardiac medications before your next appointment, please call your pharmacy.   Lab work: None ordered  If you have labs (blood work) drawn today and your tests are completely normal, you will receive your results only by:  [place] (if you have MyChart) OR  A paper copy in the mail If you have any lab test that is abnormal or we need to change your treatment, we  will call you to review the results.  Testing/Procedures: None ordered  You have been referred to [facility] for Cholesterol   Follow-Up: At [facility], you and your health needs are our priority.  As part of our continuing mission to provide you with exceptional heart care, we have created designated Provider Care Teams.  These Care Teams include your primary Cardiologist (physician) and Advanced Practice Providers (APPs -  Physician Assistants and Nurse Practitioners) who all work together to provide you with the care you need, when you need it. You will need a follow up appointment in:  3 months.  Please call our office 2 months in advance to schedule this appointment.  You may see Sherren Mocha, MD or one of the following Advanced Practice Providers on your designated Care Team: Richardson Dopp, PA-C [place], [place]  Daune Perch, NP  Any Other Special Instructions Will Be Listed Below (If Applicable).       Jarrett Soho, [place]  09/26/2018 4:06 PM    [facility] [place], [place], [place]  [address] Phone: [phone]; Fax: [phone]

## 2018-09-26 NOTE — Patient Instructions (Signed)
Medication Instructions:  Your physician recommends that you continue on your current medications as directed. Please refer to the Current Medication list given to you today.  If you need a refill on your cardiac medications before your next appointment, please call your pharmacy.   Lab work: None ordered  If you have labs (blood work) drawn today and your tests are completely normal, you will receive your results only by: Marland Kitchen MyChart Message (if you have MyChart) OR . A paper copy in the mail If you have any lab test that is abnormal or we need to change your treatment, we will call you to review the results.  Testing/Procedures: None ordered  You have been referred to [facility] for Cholesterol   Follow-Up: At [facility], you and your health needs are our priority.  As part of our continuing mission to provide you with exceptional heart care, we have created designated Provider Care Teams.  These Care Teams include your primary Cardiologist (physician) and Advanced Practice Providers (APPs -  Physician Assistants and Nurse Practitioners) who all work together to provide you with the care you need, when you need it. You will need a follow up appointment in:  3 months.  Please call our office 2 months in advance to schedule this appointment.  You may see Sherren Mocha, MD or one of the following Advanced Practice Providers on your designated Care Team: Richardson Dopp, PA-C [place], [place] . Daune Perch, NP  Any Other Special Instructions Will Be Listed Below (If Applicable).

## 2018-10-04 ENCOUNTER — Other Ambulatory Visit: Payer: Self-pay

## 2018-10-04 ENCOUNTER — Ambulatory Visit (INDEPENDENT_AMBULATORY_CARE_PROVIDER_SITE_OTHER): Payer: PPO | Admitting: Pharmacist

## 2018-10-04 DIAGNOSIS — E785 Hyperlipidemia, unspecified: Secondary | ICD-10-CM

## 2018-10-04 NOTE — Patient Instructions (Addendum)
It was a pleasure seeing you today in clinic Mr. Sassone!  Your LDL level is 101 (09/17/18). However, your LDL goal is <70 mg/dL.  Today the plan is to start...  1) Plan to start Repatha 140 mg subcutaneously every 2 weeks. The pharmacist will submit a prior authorization to get approval from the insurance company then the pharmacist will call the pharmacy to make sure you can pick it up. Afterwards, we will contact you informing you of this information. Please refer to the handout regarding how to administer the medication. If you have any questions or concerns please feel free to call the clinic to discuss any questions/concerns with the pharmacist Lenna Sciara, Mollie Germany).

## 2018-10-04 NOTE — Progress Notes (Signed)
Patient ID: Eric Pace                 DOB: [birth year]                    MRN: [id number]     HPI: Eric Pace is a 68 y.o. male patient of cardiologist, Dr. Sherren Mocha, referred to lipid clinic by physician assistant, Leanor Kail, due to concerns for elevated LDL and statin intolerance. PMH is significant for CAD/STEMI (09/16/18 aspiration thrombectomy and DES to RCA; 09/18/18 PCI of LAD and PTCA through the LAD stent struts), systolic CHF, HTN, HLD.  Pt presents for initial visit at lipid clinic in good spirits. He has initial confusion regarding which medications he is taking, therefore, medications were clarified. Pt states he is using oxymetazoline daily since tamsulosin causes him to have a "stuffy nose" each night. Pt also is having breathlessness frequently. Discussed how this is likely d/t ticagrelor, however, he is still within first day of PCI. Recommended he stay on therapy for at least 30 days then f/u to discuss with his cardiologist about other antiplatelet therapy options. Pt has had intolerance to multiple statins in the past and would prefer not to start one again.  Current Medications: ezetimibe 10 mg daily Intolerances: statins (09/16/18; side of face swelled/numb and caused numbness intermittently for 2 years); specifically simvastatin, rosuvastatin, atorvastatin  Risk Factors: HTN, HLD, CAD/STEMI, obese (BMI 30.4) LDL goal: < 70 mg/dL  Family History: Patient's family history is not on file d/t how he was adopted.  Social History: no alcohol/tobacco use  Labs: 09/17/18: TC 176, HDL 31, LDL 101, TG 222; none  09/16/18: TC 193, HDL 32, LDL 139, TG 110: none   Past Medical History:  Diagnosis Date  . CAD (coronary artery disease)    a. cath 09/16/18 aspiration thrombectomy and DES to RCA b. cath 09/18/18  PCI of LAD adn 1st diag with stenting to both and PTCA of the diagonal ostium throught the LAD stent struts  . Chronic systolic CHF (congestive heart failure) ([place])    . Essential hypertension   . Mixed hyperlipidemia     Current Outpatient Medications on File Prior to Visit  Medication Sig Dispense Refill  . aspirin EC 81 MG EC tablet Take 1 tablet (81 mg total) by mouth daily. 90 tablet 3  . Calcium Carbonate Antacid (TUMS PO) Take 3 tablets by mouth once.    . cholecalciferol (VITAMIN D3) 25 MCG (1000 UT) tablet Take 3,000 Units by mouth daily.    Marland Kitchen ezetimibe (ZETIA) 10 MG tablet Take 1 tablet (10 mg total) by mouth daily. 30 tablet 6  . lisinopril (ZESTRIL) 2.5 MG tablet Take 1 tablet (2.5 mg total) by mouth daily. 30 tablet 6  . metoprolol succinate (TOPROL-XL) 25 MG 24 hr tablet Take 1 tablet (25 mg total) by mouth daily. 30 tablet 6  . Multiple Vitamins-Minerals (PRESERVISION AREDS 2) CAPS Take 2 capsules by mouth daily.    . nitroGLYCERIN (NITROSTAT) 0.4 MG SL tablet Place 1 tablet (0.4 mg total) under the tongue every 5 (five) minutes x 3 doses as needed for chest pain. 25 tablet 12  . oxymetazoline (AFRIN) 0.05 % nasal spray Place 1 spray into both nostrils 2 (two) times daily as needed for congestion.    . tamsulosin (FLOMAX) 0.4 MG CAPS capsule Take 0.4 mg by mouth at bedtime.    . ticagrelor (BRILINTA) 90 MG TABS tablet Take 1 tablet (90 mg  total) by mouth 2 (two) times daily. 60 tablet 11   No current facility-administered medications on file prior to visit.     Allergies  Allergen Reactions  . Statins Swelling    Side of face swelled / numb and caused numbness off and on    Assessment/Plan:  1. Hyperlipidemia - Recent LDL on 09/17/18 was 101, therefore above goal of < 70 mg/dL. When discussing therapy options, pt is agreeable to starting PCSK9 inhibitor. He is comfortable with administering first injection at home. Educated pt on all pertinent pt counseling. Will f/u with pt after PA is approved and determine when to get a f/u lipid panel.  2. Improper oxymetazoline use -- Pt using oxymetazoline daily rather than for a 3-day course of  treatment, thus is likely causing rebound congestion. Recommend pt space out oxymetazoline use and to try other OTC medications to help with congestion, specifically an ICS (e.g., Flonase)  Thank you for involving pharmacy to assist with providing Mr. Marcucci care.  Drexel Iha, PharmD PGY2 Ambulatory Care Pharmacy Resident

## 2018-10-08 ENCOUNTER — Telehealth: Payer: Self-pay | Admitting: Pharmacist

## 2018-10-08 DIAGNOSIS — E785 Hyperlipidemia, unspecified: Secondary | ICD-10-CM

## 2018-10-08 MED ORDER — REPATHA SURECLICK 140 MG/ML ~~LOC~~ SOAJ: 1.0000 "pen " | SUBCUTANEOUS | 11 refills | Status: DC

## 2018-10-08 NOTE — Telephone Encounter (Signed)
Called patient. PA for Repatha approved through 10/06/2019. However cost is $180/90 day supply for $90/30 day supply. Patient states this is a little too expensive for the patient. Will submit for patient assistance through [place] for patient.

## 2018-10-09 MED ORDER — REPATHA SURECLICK 140 MG/ML ~~LOC~~ SOAJ: 1.0000 "pen " | SUBCUTANEOUS | 3 refills | Status: DC

## 2018-10-09 NOTE — Addendum Note (Signed)
Addended by: Raynah Gomes E on: 10/09/2018 08:48 AM   Modules accepted: Orders

## 2018-10-09 NOTE — Telephone Encounter (Signed)
Pt approved for $2500 in copay assistance through [place] through 09/08/19.   ID: [id number] PCN: [id number] GRP: [id number] BIN: [id number]

## 2018-10-09 NOTE — Telephone Encounter (Signed)
Spoke with pt about approval for grant from [employer].   He will pick up PCSK9inhibitor from pharmacy and call if he has any concerns.  Labs f/u scheduled for 10/28. Told pt he must be fasting.

## 2018-10-09 NOTE — Addendum Note (Signed)
Addended by: Ellwood Handler on: 10/09/2018 09:49 AM   Modules accepted: Orders

## 2018-10-10 ENCOUNTER — Telehealth: Payer: Self-pay

## 2018-10-10 NOTE — Telephone Encounter (Signed)
Received Critical Illness Claim Form that the patient dropped off at the office. Dr. Burt Knack reviewed and filled out the form today. Letter placed in outgoing mail in addressed envelope provided by patient.

## 2018-10-15 ENCOUNTER — Telehealth: Payer: Self-pay | Admitting: Cardiovascular Disease

## 2018-10-15 NOTE — Telephone Encounter (Signed)
Called pharmacy. They were not processing his rx with the [employer] info. Mistake fixed and copay is $0. Patient made aware.  Patient states when he goes to [employer] he has trouble sleeping. Wants to know if he can take tylenol PM. Advised to use sparingly but he could use benadryl or unisom or melatonin. Tylenol for headaches is ok.

## 2018-10-15 NOTE — Telephone Encounter (Signed)
New Message     Pt is calling about the Repatha, he said the pharmacy will not cover it   Pt is also wondering if he can take Tylenol or Tylenol PM   Please call

## 2018-10-18 ENCOUNTER — Telehealth: Payer: Self-pay

## 2018-10-18 NOTE — Telephone Encounter (Signed)
I spoke to the patient who sent a My Chart message indicating his BP is low 99-107/60-64 and at times he feels weak and lightheaded.  Since the recent start of Metoprolol Tartrate 25 mg daily, he has noticed lower BP and not feeling well.  He would like to hold the medication in the morning and record his BP and monitor symptoms throughout the day.  He will call with updates.

## 2018-10-21 NOTE — Telephone Encounter (Signed)
Would hold metoprolol and monitor symptoms as outlined in plan.

## 2018-10-23 ENCOUNTER — Telehealth: Payer: Self-pay

## 2018-10-23 NOTE — Telephone Encounter (Signed)
The patient reports his blood pressure is slowly increasing since decreasing metoprolol to 12.5 mg daily. The last couple times he has checked it, it's been 100s/60s. His HR remains 60s-80s.  He reports his lightheadedness and weakness is slowly improving as well.   Of note, he states he has lost 21 pounds (purposefully) since discharge. He also states he has not been drinking much water and his urine is very dark.   Upon further discussion, he states he has on and off chest tightness. It isn't "bad," but "feels like an ace wrap" is around his chest during the episodes. The tightness occurs on exertion and resolves with rest. It does NOT feel like when he had his heart attack. He has not taken any NTG. These episodes have occurred since his discharge. He specifically denies SOB, CP, nausea. He is currently asymptomatic.  Instructed the patient to stay hydrated. He understands to not allow himself to get thirsty.  Instructed him to check his BP with one monitor only (he is currently using 3 different cuffs). He will continue lisinopril 2.5 mg and metoprolol 12.5 mg daily for now and understands he will be called with Dr. Antionette Char recommendations.

## 2018-10-25 NOTE — Telephone Encounter (Signed)
Called patient to check on him. He feels much better. His BP rarely drops below 105/60s and he only gets slightly dizzy sometimes when he bends over. He is drinking more water and his urine is normal colored now.  He now believes the tightness around his chest is from a pulled muscle in his back. He will continue same medications, hydration at this time. He will continue monitoring BP and will call if he does not continue improving. He was grateful for follow-up.

## 2018-10-25 NOTE — Telephone Encounter (Signed)
Agree with recommendations as outlined. Please ask patient to call if any other problems. Keep planned follow-up.

## 2019-01-07 ENCOUNTER — Other Ambulatory Visit: Payer: PPO | Admitting: *Deleted

## 2019-01-07 ENCOUNTER — Other Ambulatory Visit: Payer: Self-pay

## 2019-01-07 DIAGNOSIS — E785 Hyperlipidemia, unspecified: Secondary | ICD-10-CM

## 2019-01-07 LAB — LIPID PANEL
Chol/HDL Ratio: 2.2 ratio (ref 0.0–5.0)
Cholesterol, Total: 93 mg/dL — ABNORMAL LOW (ref 100–199)
HDL: 43 mg/dL (ref >39)
LDL Chol Calc (NIH): 36 mg/dL (ref 0–99)
Triglycerides: 59 mg/dL (ref 0–149)
VLDL Cholesterol Cal: 14 mg/dL (ref 5–40)

## 2019-01-07 LAB — HEPATIC FUNCTION PANEL
ALT: 12 IU/L (ref 0–44)
AST: 15 IU/L (ref 0–40)
Albumin: 4.4 g/dL (ref 3.8–4.8)
Alkaline Phosphatase: 76 IU/L (ref 39–117)
Bilirubin Total: 0.5 mg/dL (ref 0.0–1.2)
Bilirubin, Direct: 0.16 mg/dL (ref 0.00–0.40)
Total Protein: 6.6 g/dL (ref 6.0–8.5)

## 2019-01-08 ENCOUNTER — Telehealth: Payer: Self-pay | Admitting: Pharmacist

## 2019-01-08 NOTE — Telephone Encounter (Signed)
Pt returned call to clinic, states he is tolerating his medications well, does notice some occasional leg pain. Advised him to call us if this worsens or becomes bothersome. Will plan to continue Repatha and Zetia for now.

## 2019-01-08 NOTE — Telephone Encounter (Signed)
Called pt on 01/08/19 at 9:10 AM. VM box was full so unable to leave HIPAA-compliant VM.  Calling pt to inform him that his recent lipid panel/LFTs drawn on 01/07/19 showed his LDL has decreased from 101 to 36. Also, LFTs appear to be stable. Plan to confirm pt remains tolerating PCSK9 inhibitor therapy; if so, plan to tell patient to continue [name]. He can follow up with lipid clinic as needed.

## 2019-01-17 ENCOUNTER — Telehealth: Payer: Self-pay

## 2019-01-17 NOTE — Telephone Encounter (Signed)
The patient understands the "no visitors" policy is in place so the office can see more patients. He will come alone to the visit. He understands his wife will be called during the visit if he would like.   He wants to discuss continued chest tightness with Dr. Burt Knack and Kary Kos pricing at the visit tomorrow. He understands he will have an EKG at the visit. ED precautions reviewed. He was grateful for assistance.

## 2019-01-17 NOTE — Telephone Encounter (Signed)
New message      Called patient to confirm appt and he wants to bring his wife along to the appt? Told patient that we were not allowing visitors in office , he wants to have wife authorized by Dr Burt Knack

## 2019-01-18 ENCOUNTER — Encounter: Payer: Self-pay | Admitting: Cardiovascular Disease

## 2019-01-18 ENCOUNTER — Other Ambulatory Visit: Payer: Self-pay

## 2019-01-18 ENCOUNTER — Ambulatory Visit: Payer: PPO | Admitting: Cardiovascular Disease

## 2019-01-18 VITALS — BP 120/64 | HR 71 | Ht 71.0 in | Wt 189.2 lb

## 2019-01-18 DIAGNOSIS — I25119 Atherosclerotic heart disease of native coronary artery with unspecified angina pectoris: Secondary | ICD-10-CM | POA: Diagnosis not present

## 2019-01-18 DIAGNOSIS — E782 Mixed hyperlipidemia: Secondary | ICD-10-CM | POA: Diagnosis not present

## 2019-01-18 DIAGNOSIS — R079 Chest pain, unspecified: Secondary | ICD-10-CM | POA: Diagnosis not present

## 2019-01-18 DIAGNOSIS — I255 Ischemic cardiomyopathy: Secondary | ICD-10-CM

## 2019-01-18 MED ORDER — EZETIMIBE 10 MG PO TABS: 10.0000 mg | ORAL_TABLET | Freq: Every day | ORAL | 6 refills | Status: DC

## 2019-01-18 MED ORDER — METOPROLOL SUCCINATE ER 25 MG PO TB24: 25.0000 mg | ORAL_TABLET | Freq: Every day | ORAL | 6 refills | Status: DC

## 2019-01-18 MED ORDER — REPATHA SURECLICK 140 MG/ML ~~LOC~~ SOAJ: 1.0000 "pen " | SUBCUTANEOUS | 3 refills | Status: DC

## 2019-01-18 MED ORDER — LISINOPRIL 2.5 MG PO TABS: 2.5000 mg | ORAL_TABLET | Freq: Every day | ORAL | 6 refills | Status: DC

## 2019-01-18 MED ORDER — TICAGRELOR 90 MG PO TABS: 90.0000 mg | ORAL_TABLET | Freq: Two times a day (BID) | ORAL | 6 refills | Status: DC

## 2019-01-18 MED ORDER — NITROGLYCERIN 0.4 MG SL SUBL: 0.4000 mg | SUBLINGUAL_TABLET | SUBLINGUAL | 6 refills | Status: DC | PRN

## 2019-01-18 NOTE — Telephone Encounter (Signed)
Patient has switched to [facility], I have refilled the other requested medications. Please fill Repatha.

## 2019-01-18 NOTE — Progress Notes (Signed)
Cardiology Office Note:    Date:  01/19/2019   ID:  Eric Pace, DOB Apr 09, 1950, MRN [id number]  PCP:  [name], MD  Cardiologist:  Sherren Mocha, MD  Electrophysiologist:  None   Referring MD: [name], MD   Chief Complaint  Patient presents with  . Chest Pain  . Coronary Artery Disease    History of Present Illness:    Eric Pace is a 68 y.o. male with a hx of coronary artery disease, presenting for follow-up evaluation.  The patient presented in July 2020 with an acute inferoposterior MI treated with primary PCI of the right coronary artery.  He underwent staged PCI of the LAD and first diagonal branches during his index hospitalization with stenting of the LAD and first diagonal as well as angioplasty of the first diagonal ostium through the LAD stent struts.  LVEF initially was in the 40 to 45% range with inferoposterior akinesis.  The patient has had statin intolerance and he has been treated with Zetia and Repatha.  He is here alone today for follow-up evaluation.  His wife is conferenced in via telephone.  The patient is experiencing frequent chest discomfort.  This can occur during or after activities.  He states he has pressure in his upper chest when he uses his arms.  Sometimes he is able to go for long walks without any chest discomfort at all.  He is walking about 4 to 6 miles per day on a very regular basis.  His wife feels strongly that he is "overdoing it."  He denies shortness of breath, orthopnea, or PND.  He does admit to fatigue and sometimes feels exhausted after certain physical activities.  His chest discomfort typically resolves after about 5 to 10 minutes of rest.  He has not taken any sublingual nitroglycerin.  He has had some problems with low blood pressure at home as well.  States today's blood pressure 120/64 is unusually high for him.    Past Medical History:  Diagnosis Date  . CAD (coronary artery disease)    a. cath 09/16/18  aspiration thrombectomy and DES to RCA b. cath 09/18/18  PCI of LAD adn 1st diag with stenting to both and PTCA of the diagonal ostium throught the LAD stent struts  . Chronic systolic CHF (congestive heart failure) ([place])   . Essential hypertension   . Mixed hyperlipidemia     Past Surgical History:  Procedure Laterality Date  . CORONARY STENT INTERVENTION N/A 09/16/2018   Procedure: CORONARY STENT INTERVENTION;  Surgeon: Sherren Mocha, MD;  Location: [facility];  Service: Cardiovascular;  Laterality: N/A;  . CORONARY STENT INTERVENTION N/A 09/18/2018   Procedure: CORONARY STENT INTERVENTION;  Surgeon: Sherren Mocha, MD;  Location: [facility];  Service: Cardiovascular;  Laterality: N/A;  . CORONARY/GRAFT ACUTE MI REVASCULARIZATION N/A 09/16/2018   Procedure: Coronary/Graft Acute MI Revascularization;  Surgeon: Sherren Mocha, MD;  Location: [facility];  Service: Cardiovascular;  Laterality: N/A;  . HEMORRHOIDECTOMY WITH HEMORRHOID BANDING    . LEFT HEART CATH AND CORONARY ANGIOGRAPHY N/A 09/16/2018   Procedure: LEFT HEART CATH AND CORONARY ANGIOGRAPHY;  Surgeon: Sherren Mocha, MD;  Location: [facility];  Service: Cardiovascular;  Laterality: N/A;    Current Medications: Current Meds  Medication Sig  . aspirin EC 81 MG EC tablet Take 1 tablet (81 mg total) by mouth daily.  . Calcium Carbonate Antacid (TUMS PO) Take 1 tablet by mouth as needed.   Marland Kitchen  cholecalciferol (VITAMIN D3) 25 MCG (1000 UT) tablet Take 3,000 Units by mouth daily.  Marland Kitchen ezetimibe (ZETIA) 10 MG tablet Take 1 tablet (10 mg total) by mouth daily.  . metoprolol succinate (TOPROL-XL) 25 MG 24 hr tablet Take 1 tablet (25 mg total) by mouth daily.  . Multiple Vitamins-Minerals (PRESERVISION AREDS 2) CAPS Take 2 capsules by mouth daily.  . nitroGLYCERIN (NITROSTAT) 0.4 MG SL tablet Place 1 tablet (0.4 mg total) under the tongue every 5 (five) minutes x 3 doses as needed for chest pain.  Marland Kitchen oxymetazoline  (AFRIN) 0.05 % nasal spray Place 1 spray into both nostrils 2 (two) times daily as needed for congestion.  . tamsulosin (FLOMAX) 0.4 MG CAPS capsule Take 0.4 mg by mouth daily after breakfast.   . ticagrelor (BRILINTA) 90 MG TABS tablet Take 1 tablet (90 mg total) by mouth 2 (two) times daily.  . [DISCONTINUED] Evolocumab (REPATHA SURECLICK) [id number] MG/ML SOAJ Inject 1 pen into the skin every 14 (fourteen) days.  . [DISCONTINUED] lisinopril (ZESTRIL) 2.5 MG tablet Take 1 tablet (2.5 mg total) by mouth daily.     Allergies:   Statins   Social History   Socioeconomic History  . Marital status: Married    Spouse name: Not on file  . Number of children: Not on file  . Years of education: Not on file  . Highest education level: Not on file  Occupational History  . Not on file  Social Needs  . Financial resource strain: Not on file  . Food insecurity    Worry: Not on file    Inability: Not on file  . Transportation needs    Medical: Not on file    Non-medical: Not on file  Tobacco Use  . Smoking status: Never Smoker  . Smokeless tobacco: Never Used  Substance and Sexual Activity  . Alcohol use: Not Currently  . Drug use: Never  . Sexual activity: Not on file  Lifestyle  . Physical activity    Days per week: Not on file    Minutes per session: Not on file  . Stress: Not on file  Relationships  . Social Herbalist on phone: Not on file    Gets together: Not on file    Attends religious service: Not on file    Active member of club or organization: Not on file    Attends meetings of clubs or organizations: Not on file    Relationship status: Not on file  Other Topics Concern  . Not on file  Social History Narrative  . Not on file     Family History: The patient's family history is not on file. He was adopted.  ROS:   Please see the history of present illness.    All other systems reviewed and are negative.  EKGs/Labs/Other Studies Reviewed:    The following  studies were reviewed today: Echo 09-17-2018: IMPRESSIONS    1. The left ventricle has mild-moderately reduced systolic function, with an ejection fraction of 40-45%. The cavity size was normal. There is moderate concentric left ventricular hypertrophy. Left ventricular diastolic Doppler parameters are  indeterminate.  2. Mild hypokinesis of the left ventricular, entire inferoseptal wall, inferior wall and inferolateral wall.  3. The right ventricle has normal systolic function. The cavity was normal. There is no increase in right ventricular wall thickness. Right ventricular systolic pressure could not be assessed.  4. No evidence of mitral valve stenosis.  5. The aortic valve is tricuspid.  Mild calcification of the aortic valve. No stenosis of the aortic valve.  6. The aortic arch and aortic root are normal in size and structure.  7. The interatrial septum was not well visualized.  Cardiac Catheterization: Conclusion  1.  Acute inferoposterior MI secondary to subtotal occlusion of the RCA, treated successfully with aspiration thrombectomy, PTCA, and drug-eluting stent implantation 2.  Severe residual stenosis involving the LAD/first diagonal 3.  Patent left main and left circumflex without high-grade obstruction 4.  Mild to moderate segmental LV systolic dysfunction with akinesis of the inferior wall and estimated LVEF of 45%  Recommendations: Staged PCI of the LAD/diagonal during the patient's index hospitalization.  CCU care and close monitoring of the patient's groin hematoma.  Type and screen is done.  Patient is hemodynamically stable.   Cardiac Cath 09-18-2018: Conclusion  Successful PCI of the LAD and first diagonal with stenting of both vessels followed by PTCA of the diagonal ostium through the LAD stent struts  Recommendations: Overnight observation, anticipate discharge tomorrow, dual antiplatelet therapy with aspirin and ticagrelor at least 12 months, aggressive risk  reduction measures.    EKG:  EKG is ordered today.  The ekg ordered today demonstrates normal sinus rhythm 65 bpm, T wave abnormality consider inferior ischemia  Recent Labs: 09/19/2018: BUN 11; Creatinine, Ser 0.82; Hemoglobin 13.8; Platelets 186; Potassium 3.9; Sodium 135 01/07/2019: ALT 12  Recent Lipid Panel    Component Value Date/Time   CHOL 93 (L) 01/07/2019 0757   TRIG 59 01/07/2019 0757   HDL 43 01/07/2019 0757   CHOLHDL 2.2 01/07/2019 0757   CHOLHDL 5.7 09/17/2018 0227   VLDL 44 (H) 09/17/2018 0227   LDLCALC 36 01/07/2019 0757    Physical Exam:    VS:  BP 120/64   Pulse 71   Ht 5\' 11"  (1.803 m)   Wt 189 lb 3.2 oz (85.8 kg)   SpO2 96%   BMI 26.39 kg/m     Wt Readings from Last 3 Encounters:  01/18/19 189 lb 3.2 oz (85.8 kg)  09/26/18 217 lb 12.8 oz (98.8 kg)  09/19/18 220 lb 14.4 oz (100.2 kg)     GEN:  Well nourished, well developed in no acute distress HEENT: Normal NECK: No JVD; No carotid bruits LYMPHATICS: No lymphadenopathy CARDIAC: RRR, no murmurs, rubs, gallops RESPIRATORY:  Clear to auscultation without rales, wheezing or rhonchi  ABDOMEN: Soft, non-tender, non-distended MUSCULOSKELETAL:  No edema; No deformity  SKIN: Warm and dry NEUROLOGIC:  Alert and oriented x 3 PSYCHIATRIC:  Normal affect   ASSESSMENT:    1. Coronary artery disease involving native coronary artery of native heart with angina pectoris ([place])   2. Ischemic cardiomyopathy   3. Mixed hyperlipidemia   4. Chest pain, unspecified type    PLAN:    In order of problems listed above:  1. Patient is having anginal chest pain with some typical and atypical symptoms.  I went back and reviewed his cardiac catheterization films and I do not appreciate any flow obstructive lesions following his PCI procedures.  I think he should be assessed with a Myoview scan to evaluate for significant residual ischemia.  The patient is having trouble with the cost of Brilinta.  He will finish his  current supply and then transition to clopidogrel 75 mg daily.  He is given specific instructions on the transition.  Since he is out beyond 3 months, he will take his last dose of Brilinta, then start clopidogrel 75 mg daily the following morning.  2. LVEF in the 40 to 45% range post MI.  He has not had reassessment by echo.  Will check a 2D echocardiogram.  I think his blood pressure is running too low based on review of his home readings with blood pressures in the 90s frequently.  I recommended that he stop lisinopril 2.5 mg daily.  He will continue on his current dose of metoprolol succinate. 3. Lipids are excellent on a combination of Repatha and Zetia.  Consider discontinuation of Zetia in the future as his cholesterol is very low. 4. Evaluate chest pain with nuclear stress test as outlined.   Medication Adjustments/Labs and Tests Ordered: Current medicines are reviewed at length with the patient today.  Concerns regarding medicines are outlined above.  Orders Placed This Encounter  Procedures  . MYOVIEW  . EKG 12-Lead  . ECHOCARDIOGRAM COMPLETE   No orders of the defined types were placed in this encounter.   Patient Instructions  Medication Instructions:  1) STOP LISINOPRIL 2) START PLAVIX 75 mg daily when you run out of Brilinta in a month. *If you need a refill on your cardiac medications before your next appointment, please call your pharmacy*  Testing/Procedures: Your provider has requested that you have an echocardiogram. Echocardiography is a painless test that uses sound waves to create images of your heart. It provides your doctor with information about the size and shape of your heart and how well your heart's chambers and valves are working. This procedure takes approximately one hour. There are no restrictions for this procedure.    Your provider has requested that you have a lexiscan myoview. For further information please visit [email or web address]. Please follow  instruction sheet, as given.  Follow-Up: At [facility], you and your health needs are our priority.  As part of our continuing mission to provide you with exceptional heart care, we have created designated Provider Care Teams.  These Care Teams include your primary Cardiologist (physician) and Advanced Practice Providers (APPs -  Physician Assistants and Nurse Practitioners) who all work together to provide you with the care you need, when you need it. Your next appointment:   4 months The format for your next appointment:   In Person Provider:   Richardson Dopp, PA     Signed, Sherren Mocha, MD  01/19/2019 7:49 AM    [place]

## 2019-01-18 NOTE — Patient Instructions (Signed)
Medication Instructions:  1) STOP LISINOPRIL 2) START PLAVIX 75 mg daily when you run out of Brilinta in a month. *If you need a refill on your cardiac medications before your next appointment, please call your pharmacy*  Testing/Procedures: Your provider has requested that you have an echocardiogram. Echocardiography is a painless test that uses sound waves to create images of your heart. It provides your doctor with information about the size and shape of your heart and how well your heart's chambers and valves are working. This procedure takes approximately one hour. There are no restrictions for this procedure.    Your provider has requested that you have a lexiscan myoview. For further information please visit [email or web address]. Please follow instruction sheet, as given.  Follow-Up: At [facility], you and your health needs are our priority.  As part of our continuing mission to provide you with exceptional heart care, we have created designated Provider Care Teams.  These Care Teams include your primary Cardiologist (physician) and Advanced Practice Providers (APPs -  Physician Assistants and Nurse Practitioners) who all work together to provide you with the care you need, when you need it. Your next appointment:   4 months The format for your next appointment:   In Person Provider:   Richardson Dopp, PA

## 2019-01-19 ENCOUNTER — Encounter: Payer: Self-pay | Admitting: Cardiovascular Disease

## 2019-01-23 ENCOUNTER — Ambulatory Visit (HOSPITAL_BASED_OUTPATIENT_CLINIC_OR_DEPARTMENT_OTHER)
Admission: RE | Admit: 2019-01-23 | Discharge: 2019-01-23 | Disposition: A | Discharge status: Home / Self Care | Payer: PPO | Source: Ambulatory Visit | Attending: Family Medicine | Admitting: Family Medicine

## 2019-01-23 ENCOUNTER — Other Ambulatory Visit: Payer: Self-pay

## 2019-01-23 ENCOUNTER — Other Ambulatory Visit (HOSPITAL_BASED_OUTPATIENT_CLINIC_OR_DEPARTMENT_OTHER): Payer: Self-pay | Admitting: Family Medicine

## 2019-01-23 ENCOUNTER — Telehealth (HOSPITAL_COMMUNITY): Payer: Self-pay | Admitting: *Deleted

## 2019-01-23 DIAGNOSIS — M79604 Pain in right leg: Secondary | ICD-10-CM

## 2019-01-23 DIAGNOSIS — M25561 Pain in right knee: Secondary | ICD-10-CM | POA: Diagnosis not present

## 2019-01-23 DIAGNOSIS — M79661 Pain in right lower leg: Secondary | ICD-10-CM | POA: Diagnosis not present

## 2019-01-23 DIAGNOSIS — M7989 Other specified soft tissue disorders: Secondary | ICD-10-CM | POA: Diagnosis not present

## 2019-01-23 DIAGNOSIS — M7121 Synovial cyst of popliteal space [Baker], right knee: Secondary | ICD-10-CM | POA: Diagnosis not present

## 2019-01-23 NOTE — Telephone Encounter (Signed)
Patient given detailed instructions per Myocardial Perfusion Study Information Sheet for the test on 01/28/19. Patient notified to arrive 15 minutes early and that it is imperative to arrive on time for appointment to keep from having the test rescheduled.  If you need to cancel or reschedule your appointment, please call the office within 24 hours of your appointment. . Patient verbalized understanding. Eric Pace Jacqueline    

## 2019-01-23 NOTE — Telephone Encounter (Signed)
Called pt regarding his mychart message about his leg swelling.   He reports his right leg began swelling on Saturday and has progressively gotten worse. It starts below his knee. He has iced it off and on and has been elevating it. He reports from his knee down to his ankle is sore to the touch.   Pt stopped taking his Lisinopril on Saturday.   Spoke with DOD, Dr. Radford Pax who suggested the pt go to his PCP. After discussing recommendations with pt he stated he would call his PCP. Advised pt to call us back with any issues.

## 2019-01-25 ENCOUNTER — Other Ambulatory Visit: Payer: Self-pay

## 2019-01-25 ENCOUNTER — Encounter: Payer: Self-pay | Admitting: Family Medicine

## 2019-01-25 ENCOUNTER — Telehealth: Payer: Self-pay | Admitting: Cardiovascular Disease

## 2019-01-25 ENCOUNTER — Ambulatory Visit: Payer: PPO | Admitting: Family Medicine

## 2019-01-25 ENCOUNTER — Ambulatory Visit: Payer: Self-pay

## 2019-01-25 DIAGNOSIS — I2111 ST elevation (STEMI) myocardial infarction involving right coronary artery: Secondary | ICD-10-CM | POA: Diagnosis not present

## 2019-01-25 DIAGNOSIS — M25561 Pain in right knee: Secondary | ICD-10-CM

## 2019-01-25 DIAGNOSIS — I10 Essential (primary) hypertension: Secondary | ICD-10-CM | POA: Diagnosis not present

## 2019-01-25 DIAGNOSIS — N400 Enlarged prostate without lower urinary tract symptoms: Secondary | ICD-10-CM | POA: Diagnosis not present

## 2019-01-25 DIAGNOSIS — E785 Hyperlipidemia, unspecified: Secondary | ICD-10-CM | POA: Diagnosis not present

## 2019-01-25 DIAGNOSIS — I25110 Atherosclerotic heart disease of native coronary artery with unstable angina pectoris: Secondary | ICD-10-CM | POA: Diagnosis not present

## 2019-01-25 DIAGNOSIS — E782 Mixed hyperlipidemia: Secondary | ICD-10-CM | POA: Diagnosis not present

## 2019-01-25 MED ORDER — CLOPIDOGREL BISULFATE 75 MG PO TABS: 75.0000 mg | ORAL_TABLET | Freq: Every day | ORAL | 3 refills | Status: DC

## 2019-01-25 NOTE — Telephone Encounter (Signed)
Called Dr. Harrington Challenger' office and was given direct number for pharmacy line.  Left message to call back to review medication changes. The patient was to STOP LISINOPRIL and then switch from Brilinta to Plavix when he runs out. The med was not called in yet because the patient still had a month left of Brilinta.  Pharmacy line:  [phone]

## 2019-01-25 NOTE — Progress Notes (Signed)
Office Visit Note   Patient: Eric Pace           Date of Birth: [birth year]           MRN: [id number] Visit Date: 01/25/2019 Requested by: [name], [place],  [place] [address] PCP: [name], MD  Subjective: Chief Complaint  Patient presents with  . Right Knee - Pain    Pain in the knee, with swelling since 01/19/19  - twisted the knee. Swelling has gone down - gets better as he walks/moves around.    HPI: He is here with right knee pain.  On November 7 he twisted with his foot planted, he felt a twinge of pain in his knee.  It was not severe pain but the next morning he woke up with his knee swollen and painful mostly on the posterior medial aspect.  Since then his lower leg has been swollen.  His cardiologist referred him for ultrasound imaging which showed a popliteal cyst but no sign of blood clot.  He apparently has some x-rays as well but I was not able to see those.  Today he is starting to feel better.  He is still having swelling but he is able to walk for exercise without limping.  He has tightness in the back of his knee when he first stands up.  He had a heart attack in July and is doing well from that standpoint.  With dietary changes he has lost more than 40 pounds.               ROS: No fevers or chills.  All other systems were reviewed and are negative.  Objective: Vital Signs: There were no vitals taken for this visit.  Physical Exam:  General:  Alert and oriented, in no acute distress. Pulm:  Breathing unlabored. Psy:  Normal mood, congruent affect. Skin: No rash or erythema. Right knee: He has abundant varicose veins.  He has 2+ intra-articular effusion with no warmth.  He has slight tenderness on the posterior medial joint line but maximum tenderness is in the popliteal area over a Baker's cyst.  He has 2+ pitting edema in his lower leg.  Imaging: Limited diagnostic ultrasound right knee: He has an intra-articular  effusion with no hyperemia on power Doppler imaging.  Posteriorly he has a popliteal cyst which has ruptured and fluid is seen tracking down his leg.  Assessment & Plan: 1.  Clinically improving right knee pain with intra-articular effusion and ruptured popliteal cyst, suspect degenerative meniscus tear -Reassurance, anticipate a few more weeks of lower extremity edema which should eventually subside.  If he has a flareup of pain, he will come in for possible knee aspiration and intra-articular injection.     Procedures: No procedures performed  No notes on file     PMFS History: Patient Active Problem List   Diagnosis Date Noted  . Hyperlipemia 10/04/2018  . Inferoposterior myocardial infarction ([place]) 09/16/2018  . STEMI involving right coronary artery ([place]) 09/16/2018   Past Medical History:  Diagnosis Date  . CAD (coronary artery disease)    a. cath 09/16/18 aspiration thrombectomy and DES to RCA b. cath 09/18/18  PCI of LAD adn 1st diag with stenting to both and PTCA of the diagonal ostium throught the LAD stent struts  . Chronic systolic CHF (congestive heart failure) ([place])   . Essential hypertension   . Mixed hyperlipidemia     Family History  Adopted: Yes  Past Surgical History:  Procedure Laterality Date  . CORONARY STENT INTERVENTION N/A 09/16/2018   Procedure: CORONARY STENT INTERVENTION;  Surgeon: Sherren Mocha, MD;  Location: [facility];  Service: Cardiovascular;  Laterality: N/A;  . CORONARY STENT INTERVENTION N/A 09/18/2018   Procedure: CORONARY STENT INTERVENTION;  Surgeon: Sherren Mocha, MD;  Location: [facility];  Service: Cardiovascular;  Laterality: N/A;  . CORONARY/GRAFT ACUTE MI REVASCULARIZATION N/A 09/16/2018   Procedure: Coronary/Graft Acute MI Revascularization;  Surgeon: Sherren Mocha, MD;  Location: [facility];  Service: Cardiovascular;  Laterality: N/A;  . HEMORRHOIDECTOMY WITH HEMORRHOID BANDING    . LEFT HEART CATH AND  CORONARY ANGIOGRAPHY N/A 09/16/2018   Procedure: LEFT HEART CATH AND CORONARY ANGIOGRAPHY;  Surgeon: Sherren Mocha, MD;  Location: [facility];  Service: Cardiovascular;  Laterality: N/A;   Social History   Occupational History  . Not on file  Tobacco Use  . Smoking status: Never Smoker  . Smokeless tobacco: Never Used  Substance and Sexual Activity  . Alcohol use: Not Currently  . Drug use: Never  . Sexual activity: Not on file

## 2019-01-25 NOTE — Telephone Encounter (Signed)
Spoke with Dorothea Ogle, pharmacist with [facility].  Informed him of medication changes and he requests Plavix 75 mg daily called in so they can prepare.  Dorothea Ogle was grateful for clarification.

## 2019-01-25 NOTE — Telephone Encounter (Signed)
Dr. Harrington Challenger from [facility] wanted to confirm that the patient has stopped taking lisinopril and start taking plavix  as a replacement. [facility] wants to get the new rx sent to the pharmacy on behalf of the patient

## 2019-01-28 ENCOUNTER — Ambulatory Visit (HOSPITAL_BASED_OUTPATIENT_CLINIC_OR_DEPARTMENT_OTHER): Payer: PPO

## 2019-01-28 ENCOUNTER — Other Ambulatory Visit: Payer: Self-pay

## 2019-01-28 ENCOUNTER — Ambulatory Visit (HOSPITAL_COMMUNITY): Payer: PPO | Attending: Cardiology

## 2019-01-28 DIAGNOSIS — R079 Chest pain, unspecified: Secondary | ICD-10-CM | POA: Diagnosis not present

## 2019-01-28 DIAGNOSIS — I255 Ischemic cardiomyopathy: Secondary | ICD-10-CM | POA: Diagnosis not present

## 2019-01-28 DIAGNOSIS — I25119 Atherosclerotic heart disease of native coronary artery with unspecified angina pectoris: Secondary | ICD-10-CM | POA: Diagnosis not present

## 2019-01-28 LAB — MYOCARDIAL PERFUSION IMAGING
LV dias vol: 103 mL (ref 62–150)
LV sys vol: 47 mL
Peak HR: 85 {beats}/min
Rest HR: 52 {beats}/min
SDS: 1
SRS: 0
SSS: 1
TID: 0.89

## 2019-01-28 MED ORDER — TECHNETIUM TC 99M TETROFOSMIN IV KIT
31.7000 | PACK | Freq: Once | INTRAVENOUS | Status: AC | PRN
  Administered 2019-01-28: 31.7 via INTRAVENOUS
  Filled 2019-01-28: qty 32

## 2019-01-28 MED ORDER — REGADENOSON 0.4 MG/5ML IV SOLN
0.4000 mg | Freq: Once | INTRAVENOUS | Status: AC
  Administered 2019-01-28: 0.4 mg via INTRAVENOUS

## 2019-01-28 MED ORDER — TECHNETIUM TC 99M TETROFOSMIN IV KIT
10.1000 | PACK | Freq: Once | INTRAVENOUS | Status: AC | PRN
  Administered 2019-01-28: 10.1 via INTRAVENOUS
  Filled 2019-01-28: qty 11

## 2019-01-29 ENCOUNTER — Other Ambulatory Visit: Payer: Self-pay

## 2019-01-29 ENCOUNTER — Encounter: Payer: Self-pay | Admitting: *Deleted

## 2019-01-29 DIAGNOSIS — R591 Generalized enlarged lymph nodes: Secondary | ICD-10-CM | POA: Diagnosis not present

## 2019-01-29 DIAGNOSIS — Z23 Encounter for immunization: Secondary | ICD-10-CM | POA: Diagnosis not present

## 2019-01-29 MED ORDER — CLOPIDOGREL BISULFATE 75 MG PO TABS: 75.0000 mg | ORAL_TABLET | Freq: Every day | ORAL | 3 refills | Status: AC

## 2019-02-19 ENCOUNTER — Encounter (HOSPITAL_COMMUNITY): Payer: PPO

## 2019-02-19 ENCOUNTER — Other Ambulatory Visit (HOSPITAL_COMMUNITY): Payer: PPO

## 2019-02-27 DIAGNOSIS — E782 Mixed hyperlipidemia: Secondary | ICD-10-CM | POA: Diagnosis not present

## 2019-02-27 DIAGNOSIS — I25110 Atherosclerotic heart disease of native coronary artery with unstable angina pectoris: Secondary | ICD-10-CM | POA: Diagnosis not present

## 2019-02-27 DIAGNOSIS — E785 Hyperlipidemia, unspecified: Secondary | ICD-10-CM | POA: Diagnosis not present

## 2019-02-27 DIAGNOSIS — N400 Enlarged prostate without lower urinary tract symptoms: Secondary | ICD-10-CM | POA: Diagnosis not present

## 2019-02-27 DIAGNOSIS — I10 Essential (primary) hypertension: Secondary | ICD-10-CM | POA: Diagnosis not present

## 2019-02-27 DIAGNOSIS — I2111 ST elevation (STEMI) myocardial infarction involving right coronary artery: Secondary | ICD-10-CM | POA: Diagnosis not present

## 2019-03-26 DIAGNOSIS — I25110 Atherosclerotic heart disease of native coronary artery with unstable angina pectoris: Secondary | ICD-10-CM | POA: Diagnosis not present

## 2019-03-26 DIAGNOSIS — I2111 ST elevation (STEMI) myocardial infarction involving right coronary artery: Secondary | ICD-10-CM | POA: Diagnosis not present

## 2019-03-26 DIAGNOSIS — E785 Hyperlipidemia, unspecified: Secondary | ICD-10-CM | POA: Diagnosis not present

## 2019-03-26 DIAGNOSIS — I10 Essential (primary) hypertension: Secondary | ICD-10-CM | POA: Diagnosis not present

## 2019-03-26 DIAGNOSIS — E782 Mixed hyperlipidemia: Secondary | ICD-10-CM | POA: Diagnosis not present

## 2019-03-26 DIAGNOSIS — N400 Enlarged prostate without lower urinary tract symptoms: Secondary | ICD-10-CM | POA: Diagnosis not present

## 2019-03-28 ENCOUNTER — Telehealth: Payer: Self-pay | Admitting: Cardiovascular Disease

## 2019-03-28 NOTE — Telephone Encounter (Signed)
   Primary Cardiologist: Sherren Mocha, MD  Chart reviewed as part of pre-operative protocol coverage. I called dentist office and clarified that patient only needs 1 tooth extracted. Simple dental extractions are considered low risk procedures per guidelines and generally do not require any specific cardiac clearance. It is also generally accepted that for simple extractions and dental cleanings, there is no need to interrupt blood thinner therapy.   I will route this recommendation to the requesting party via Epic fax function and remove from pre-op pool.  Please call with questions.  Darreld Mclean, PA-C 03/28/2019, 9:23 AM

## 2019-03-28 NOTE — Telephone Encounter (Signed)
° °  [facility] Pre-operative Risk Assessment    Request for surgical clearance:  1. What type of surgery is being performed? Tooth Extraction /Oral Surgery  2. When is this surgery scheduled? TBD Based on clearance  3. What type of clearance is required (medical clearance vs. Pharmacy clearance to hold med vs. Both)? pharmacy  4. Are there any medications that need to be held prior to surgery and how long? Plavix and how long to be off if necessary  5. Practice name and name of physician performing surgery? Dr. Nanine Means, [facility]  6. What is your office phone number: [phone]   7.   What is your office fax number: [phone] or [phone]  8.   Anesthesia type (None, local, MAC, general) ? no   Eric Pace 03/28/2019, 9:07 AM  _________________________________________________________________   (provider comments below)

## 2019-04-08 ENCOUNTER — Encounter (INDEPENDENT_AMBULATORY_CARE_PROVIDER_SITE_OTHER): Payer: PPO | Admitting: Ophthalmology

## 2019-04-08 DIAGNOSIS — H2513 Age-related nuclear cataract, bilateral: Secondary | ICD-10-CM

## 2019-04-08 DIAGNOSIS — H35373 Puckering of macula, bilateral: Secondary | ICD-10-CM | POA: Diagnosis not present

## 2019-04-08 DIAGNOSIS — H43813 Vitreous degeneration, bilateral: Secondary | ICD-10-CM

## 2019-04-08 DIAGNOSIS — H353122 Nonexudative age-related macular degeneration, left eye, intermediate dry stage: Secondary | ICD-10-CM

## 2019-04-19 DIAGNOSIS — I2111 ST elevation (STEMI) myocardial infarction involving right coronary artery: Secondary | ICD-10-CM | POA: Diagnosis not present

## 2019-04-19 DIAGNOSIS — E785 Hyperlipidemia, unspecified: Secondary | ICD-10-CM | POA: Diagnosis not present

## 2019-04-19 DIAGNOSIS — E782 Mixed hyperlipidemia: Secondary | ICD-10-CM | POA: Diagnosis not present

## 2019-04-19 DIAGNOSIS — I25110 Atherosclerotic heart disease of native coronary artery with unstable angina pectoris: Secondary | ICD-10-CM | POA: Diagnosis not present

## 2019-04-19 DIAGNOSIS — I10 Essential (primary) hypertension: Secondary | ICD-10-CM | POA: Diagnosis not present

## 2019-04-19 DIAGNOSIS — N400 Enlarged prostate without lower urinary tract symptoms: Secondary | ICD-10-CM | POA: Diagnosis not present

## 2019-05-20 ENCOUNTER — Ambulatory Visit: Payer: PPO | Admitting: Physician Assistant

## 2019-05-20 NOTE — Progress Notes (Signed)
Cardiology Office Note:    Date:  05/21/2019   ID:  Eric Pace, DOB 1950/10/16, MRN [id number]  PCP:  [name], MD  Cardiologist:  Sherren Mocha, MD   Electrophysiologist:  None   Referring MD: [name], MD   Chief Complaint:  Follow-up (CAD)    Patient Profile:    Eric Pace is a 69 y.o. male with:   Coronary artery disease   S/p inf-post STEMI in 09/2018 >> DES to RCA  S/p staged PCI 09/2018:  DES to LAD, DES to Dx  Myoview 01/2019: no ischemia  HFrEF 2/2 Ischemic CM, w/ return of normal EF  EF 09/2018: 40-45  ACEi stopped due to low BP 01/2019  Echocardiogram 01/2019: EF 55-60  Hypertension   Hyperlipidemia   Intol of statins  Rx w/ Evolocumab, Ezetimibe  Prior CV studies: Myoview 01/28/2019 No ischemia or prior infarction, EF 55%; Low Risk   Echocardiogram 01/28/2019 GLS -18.6%, EF 55-60, normal RVSF, mild LAE, trace MR, mild TR, mild AS (mean 10 mmHg)  Cardiac catheterization 09/18/2018 LAD proximal 90; D1 90 LCx proximal 40 RCA proximal stent patent PCI: 2.75 x 12 mm Resolute Onyx DES to the proximal LAD PCI: 2.25 x 12 mm Resolute Onyx DES to the D1  Echo 09/17/2018 EF 40-45, moderate LVH, inferoseptal/inferior/inferolateral HK, normal RV SF  Cardiac catheterization 09/16/2018 LAD proximal 90; D1 90 LCx proximal 40 RCA proximal 99 (ulcerative and heavily thrombotic) EF 45-50 PCI: 4 x 30 mm Resolute Onyx DES to the proximal RCA  History of Present Illness:    Eric Pace was last seen by Dr. Burt Knack in November 2020.  He had some chest discomfort at that time and was set up for a Myoview.  This demonstrated no ischemia.  He was also set up for an echocardiogram.  This demonstrated improved LV function with an EF 55-60%.  I reviewed the results of his tests with him today.  He notes that he has recently stopped drinking iced tea.  This seems to have improved his chest symptoms somewhat.  Of note, he also had significant  improvement in his chest symptoms after changing Ticagrelor to Clopidogrel.  He mainly feels chest discomfort with certain upper extremity movements.  However, yesterday he was able to move large amounts of mulch without discomfort.  He has not had significant shortness of breath, orthopnea, syncope, leg swelling.     Past Medical History:  Diagnosis Date   CAD (coronary artery disease)    a. cath 09/16/18 aspiration thrombectomy and DES to RCA b. cath 09/18/18  PCI of LAD adn 1st diag with stenting to both and PTCA of the diagonal ostium throught the LAD stent struts   Chronic systolic CHF (congestive heart failure) (HCC)    Essential hypertension    Mixed hyperlipidemia     Current Medications: Current Meds  Medication Sig   aspirin EC 81 MG EC tablet Take 1 tablet (81 mg total) by mouth daily.   Calcium Carbonate Antacid (TUMS PO) Take 1 tablet by mouth as needed.    cholecalciferol (VITAMIN D3) 25 MCG (1000 UT) tablet Take 3,000 Units by mouth daily.   clopidogrel (PLAVIX) 75 MG tablet Take 1 tablet (75 mg total) by mouth daily.   Evolocumab (REPATHA SURECLICK) [id number] MG/ML SOAJ Inject 1 pen into the skin every 14 (fourteen) days.   ezetimibe (ZETIA) 10 MG tablet Take 1 tablet (10 mg total) by mouth daily.   metoprolol succinate (TOPROL-XL) 25 MG  24 hr tablet Take 1 tablet (25 mg total) by mouth daily.   Multiple Vitamins-Minerals (PRESERVISION AREDS 2) CAPS Take 2 capsules by mouth daily.   nitroGLYCERIN (NITROSTAT) 0.4 MG SL tablet Place 1 tablet (0.4 mg total) under the tongue every 5 (five) minutes x 3 doses as needed for chest pain.   oxymetazoline (AFRIN) 0.05 % nasal spray Place 1 spray into both nostrils 2 (two) times daily as needed for congestion.   tamsulosin (FLOMAX) 0.4 MG CAPS capsule Take 0.4 mg by mouth daily after breakfast.      Allergies:   Statins   Social History   Tobacco Use   Smoking status: Never Smoker   Smokeless tobacco: Never Used    Substance Use Topics   Alcohol use: Not Currently   Drug use: Never     Family Hx: The patient's family history is not on file. He was adopted.  ROS   EKGs/Labs/Other Test Reviewed:    EKG:  EKG is not ordered today.  The ekg ordered today demonstrates n/a  Recent Labs: 09/19/2018: BUN 11; Creatinine, Ser 0.82; Hemoglobin 13.8; Platelets 186; Potassium 3.9; Sodium 135 01/07/2019: ALT 12   Recent Lipid Panel Lab Results  Component Value Date/Time   CHOL 93 (L) 01/07/2019 07:57 AM   TRIG 59 01/07/2019 07:57 AM   HDL 43 01/07/2019 07:57 AM   CHOLHDL 2.2 01/07/2019 07:57 AM   CHOLHDL 5.7 09/17/2018 02:27 AM   LDLCALC 36 01/07/2019 07:57 AM    Physical Exam:    VS:  BP 118/62    Pulse 69    Ht 5\' 11"  (1.803 m)    Wt 185 lb (83.9 kg)    SpO2 100%    BMI 25.80 kg/m     Wt Readings from Last 3 Encounters:  05/21/19 185 lb (83.9 kg)  01/28/19 189 lb (85.7 kg)  01/18/19 189 lb 3.2 oz (85.8 kg)     Constitutional:      Appearance: Healthy appearance. Not in distress.  Neck:     Thyroid: Thyroid normal.     Vascular: JVD normal.  Pulmonary:     Effort: Pulmonary effort is normal.     Breath sounds: No wheezing. No rales.  Cardiovascular:     Normal rate. Regular rhythm. Normal S1. Normal S2.     Murmurs: There is a grade 1/6 early systolic murmur at the LLSB.  Edema:    Peripheral edema absent.  Abdominal:     Palpations: Abdomen is soft. There is no hepatomegaly.  Skin:    General: Skin is warm and dry.  Neurological:     General: No focal deficit present.     Mental Status: Alert and oriented to person, place and time.     Cranial Nerves: Cranial nerves are intact.       ASSESSMENT & PLAN:    1. Coronary artery disease involving native coronary artery of native heart with angina pectoris [facility]) Status post inferior myocardial infarction July 2020 treated with a drug-eluting stent to the RCA.  He underwent staged PCI with a drug-eluting stent to the LAD and a  drug-eluting stent to the diagonal.  Recent Myoview was low risk.  As noted, I reviewed the results of his test with him.  He has continued to have some chest symptoms.  However, he had significant improvement after stopping ticagrelor and switching to clopidogrel.  He has also noted improvement in his symptoms since decreasing caffeine.  I have suggested that he try  taking famotidine for a week and see if this helps.  At this point, he does not require further cardiovascular testing.  He will continue on aspirin, clopidogrel, ezetimibe, evolocumab, metoprolol succinate.  Follow-up with Dr. Burt Knack in 6 months.  At that visit, we can decide if he should remain on dual antiplatelet therapy or switch to clopidogrel alone.  2. Ischemic cardiomyopathy EF is 40-45 at the time of his myocardial infarction.  EF improved to 55-60% by recent echocardiogram.  Continue beta-blocker therapy.  3. Essential hypertension The patient's blood pressure is controlled on his current regimen.  Continue current therapy.   4. Hyperlipidemia LDL goal <70 LDL optimal on most recent lab work.  Continue current Rx.     Dispo:  Return in about 6 months (around 11/21/2019) for Routine Follow Up, w/ Dr. Burt Knack, in person.   Medication Adjustments/Labs and Tests Ordered: Current medicines are reviewed at length with the patient today.  Concerns regarding medicines are outlined above.  Tests Ordered: No orders of the defined types were placed in this encounter.  Medication Changes: No orders of the defined types were placed in this encounter.  Signed, Richardson Dopp, PA-C  05/21/2019 1:55 PM    [facility] [place], [place],   [address] Phone: [phone]; Fax: [phone]

## 2019-05-21 ENCOUNTER — Ambulatory Visit (INDEPENDENT_AMBULATORY_CARE_PROVIDER_SITE_OTHER): Payer: PPO | Admitting: Physician Assistant

## 2019-05-21 ENCOUNTER — Other Ambulatory Visit: Payer: Self-pay

## 2019-05-21 ENCOUNTER — Encounter: Payer: Self-pay | Admitting: Physician Assistant

## 2019-05-21 VITALS — BP 118/62 | HR 69 | Ht 71.0 in | Wt 185.0 lb

## 2019-05-21 DIAGNOSIS — E785 Hyperlipidemia, unspecified: Secondary | ICD-10-CM

## 2019-05-21 DIAGNOSIS — I10 Essential (primary) hypertension: Secondary | ICD-10-CM

## 2019-05-21 DIAGNOSIS — I255 Ischemic cardiomyopathy: Secondary | ICD-10-CM

## 2019-05-21 DIAGNOSIS — I25119 Atherosclerotic heart disease of native coronary artery with unspecified angina pectoris: Secondary | ICD-10-CM | POA: Diagnosis not present

## 2019-05-21 NOTE — Patient Instructions (Signed)
Medication Instructions:  °Your physician recommends that you continue on your current medications as directed. Please refer to the Current Medication list given to you today. ° °*If you need a refill on your cardiac medications before your next appointment, please call your pharmacy* ° °Lab Work: °None ordered today ° °If you have labs (blood work) drawn today and your tests are completely normal, you will receive your results only by: °• MyChart Message (if you have MyChart) OR °• A paper copy in the mail °If you have any lab test that is abnormal or we need to change your treatment, we will call you to review the results. ° °Testing/Procedures: °None ordered today ° °Follow-Up: °At [facility], you and your health needs are our priority.  As part of our continuing mission to provide you with exceptional heart care, we have created designated Provider Care Teams.  These Care Teams include your primary Cardiologist (physician) and Advanced Practice Providers (APPs -  Physician Assistants and Nurse Practitioners) who all work together to provide you with the care you need, when you need it. ° °We recommend signing up for the patient portal called "MyChart".  Sign up information is provided on this After Visit Summary.  MyChart is used to connect with patients for Virtual Visits (Telemedicine).  Patients are able to view lab/test results, encounter notes, upcoming appointments, etc.  Non-urgent messages can be sent to your provider as well.   °To learn more about what you can do with MyChart, go to [email or web address].   ° °Your next appointment:   °6 month(s) ° °The format for your next appointment:   °In Person ° °Provider:   °Michael Cooper, MD ° °

## 2019-06-05 DIAGNOSIS — S61411A Laceration without foreign body of right hand, initial encounter: Secondary | ICD-10-CM | POA: Diagnosis not present

## 2019-06-05 DIAGNOSIS — S60410A Abrasion of right index finger, initial encounter: Secondary | ICD-10-CM | POA: Diagnosis not present

## 2019-06-05 DIAGNOSIS — S60511A Abrasion of right hand, initial encounter: Secondary | ICD-10-CM | POA: Diagnosis not present

## 2019-06-11 DIAGNOSIS — Z0000 Encounter for general adult medical examination without abnormal findings: Secondary | ICD-10-CM | POA: Diagnosis not present

## 2019-06-11 DIAGNOSIS — Z79899 Other long term (current) drug therapy: Secondary | ICD-10-CM | POA: Diagnosis not present

## 2019-06-11 DIAGNOSIS — I2510 Atherosclerotic heart disease of native coronary artery without angina pectoris: Secondary | ICD-10-CM | POA: Diagnosis not present

## 2019-06-11 DIAGNOSIS — L309 Dermatitis, unspecified: Secondary | ICD-10-CM | POA: Diagnosis not present

## 2019-06-11 DIAGNOSIS — E782 Mixed hyperlipidemia: Secondary | ICD-10-CM | POA: Diagnosis not present

## 2019-06-11 DIAGNOSIS — N400 Enlarged prostate without lower urinary tract symptoms: Secondary | ICD-10-CM | POA: Diagnosis not present

## 2019-06-11 DIAGNOSIS — Z125 Encounter for screening for malignant neoplasm of prostate: Secondary | ICD-10-CM | POA: Diagnosis not present

## 2019-06-11 DIAGNOSIS — E559 Vitamin D deficiency, unspecified: Secondary | ICD-10-CM | POA: Diagnosis not present

## 2019-06-16 DIAGNOSIS — R040 Epistaxis: Secondary | ICD-10-CM | POA: Diagnosis not present

## 2019-06-16 DIAGNOSIS — I10 Essential (primary) hypertension: Secondary | ICD-10-CM | POA: Diagnosis not present

## 2019-06-16 DIAGNOSIS — Z7902 Long term (current) use of antithrombotics/antiplatelets: Secondary | ICD-10-CM | POA: Diagnosis not present

## 2019-06-16 DIAGNOSIS — Z955 Presence of coronary angioplasty implant and graft: Secondary | ICD-10-CM | POA: Diagnosis not present

## 2019-06-17 DIAGNOSIS — R040 Epistaxis: Secondary | ICD-10-CM | POA: Diagnosis not present

## 2019-06-17 DIAGNOSIS — J310 Chronic rhinitis: Secondary | ICD-10-CM | POA: Diagnosis not present

## 2019-06-19 DIAGNOSIS — I10 Essential (primary) hypertension: Secondary | ICD-10-CM | POA: Diagnosis not present

## 2019-06-19 DIAGNOSIS — I25110 Atherosclerotic heart disease of native coronary artery with unstable angina pectoris: Secondary | ICD-10-CM | POA: Diagnosis not present

## 2019-06-19 DIAGNOSIS — E785 Hyperlipidemia, unspecified: Secondary | ICD-10-CM | POA: Diagnosis not present

## 2019-06-19 DIAGNOSIS — I2510 Atherosclerotic heart disease of native coronary artery without angina pectoris: Secondary | ICD-10-CM | POA: Diagnosis not present

## 2019-06-19 DIAGNOSIS — E782 Mixed hyperlipidemia: Secondary | ICD-10-CM | POA: Diagnosis not present

## 2019-06-19 DIAGNOSIS — I2111 ST elevation (STEMI) myocardial infarction involving right coronary artery: Secondary | ICD-10-CM | POA: Diagnosis not present

## 2019-06-19 DIAGNOSIS — N400 Enlarged prostate without lower urinary tract symptoms: Secondary | ICD-10-CM | POA: Diagnosis not present

## 2019-07-11 DIAGNOSIS — N401 Enlarged prostate with lower urinary tract symptoms: Secondary | ICD-10-CM | POA: Diagnosis not present

## 2019-07-11 DIAGNOSIS — R351 Nocturia: Secondary | ICD-10-CM | POA: Diagnosis not present

## 2019-07-11 DIAGNOSIS — R3914 Feeling of incomplete bladder emptying: Secondary | ICD-10-CM | POA: Diagnosis not present

## 2019-08-02 DIAGNOSIS — S20229A Contusion of unspecified back wall of thorax, initial encounter: Secondary | ICD-10-CM | POA: Diagnosis not present

## 2019-08-02 DIAGNOSIS — I252 Old myocardial infarction: Secondary | ICD-10-CM | POA: Diagnosis not present

## 2019-08-02 DIAGNOSIS — J9811 Atelectasis: Secondary | ICD-10-CM | POA: Diagnosis not present

## 2019-08-02 DIAGNOSIS — R0781 Pleurodynia: Secondary | ICD-10-CM | POA: Diagnosis not present

## 2019-08-02 DIAGNOSIS — I10 Essential (primary) hypertension: Secondary | ICD-10-CM | POA: Diagnosis not present

## 2019-08-02 DIAGNOSIS — S298XXA Other specified injuries of thorax, initial encounter: Secondary | ICD-10-CM | POA: Diagnosis not present

## 2019-08-02 DIAGNOSIS — S20219A Contusion of unspecified front wall of thorax, initial encounter: Secondary | ICD-10-CM | POA: Diagnosis not present

## 2019-08-23 DIAGNOSIS — E785 Hyperlipidemia, unspecified: Secondary | ICD-10-CM | POA: Diagnosis not present

## 2019-08-23 DIAGNOSIS — N400 Enlarged prostate without lower urinary tract symptoms: Secondary | ICD-10-CM | POA: Diagnosis not present

## 2019-08-23 DIAGNOSIS — E782 Mixed hyperlipidemia: Secondary | ICD-10-CM | POA: Diagnosis not present

## 2019-08-23 DIAGNOSIS — I10 Essential (primary) hypertension: Secondary | ICD-10-CM | POA: Diagnosis not present

## 2019-08-23 DIAGNOSIS — I25110 Atherosclerotic heart disease of native coronary artery with unstable angina pectoris: Secondary | ICD-10-CM | POA: Diagnosis not present

## 2019-08-23 DIAGNOSIS — I2510 Atherosclerotic heart disease of native coronary artery without angina pectoris: Secondary | ICD-10-CM | POA: Diagnosis not present

## 2019-08-23 DIAGNOSIS — I2111 ST elevation (STEMI) myocardial infarction involving right coronary artery: Secondary | ICD-10-CM | POA: Diagnosis not present

## 2019-08-26 DIAGNOSIS — R3914 Feeling of incomplete bladder emptying: Secondary | ICD-10-CM | POA: Diagnosis not present

## 2019-08-26 DIAGNOSIS — N401 Enlarged prostate with lower urinary tract symptoms: Secondary | ICD-10-CM | POA: Diagnosis not present

## 2019-09-09 ENCOUNTER — Other Ambulatory Visit: Payer: Self-pay | Admitting: Cardiovascular Disease

## 2019-09-11 DIAGNOSIS — K4090 Unilateral inguinal hernia, without obstruction or gangrene, not specified as recurrent: Secondary | ICD-10-CM | POA: Diagnosis not present

## 2019-09-17 DIAGNOSIS — I10 Essential (primary) hypertension: Secondary | ICD-10-CM | POA: Diagnosis not present

## 2019-09-17 DIAGNOSIS — I2111 ST elevation (STEMI) myocardial infarction involving right coronary artery: Secondary | ICD-10-CM | POA: Diagnosis not present

## 2019-09-17 DIAGNOSIS — E785 Hyperlipidemia, unspecified: Secondary | ICD-10-CM | POA: Diagnosis not present

## 2019-09-17 DIAGNOSIS — E782 Mixed hyperlipidemia: Secondary | ICD-10-CM | POA: Diagnosis not present

## 2019-09-17 DIAGNOSIS — N400 Enlarged prostate without lower urinary tract symptoms: Secondary | ICD-10-CM | POA: Diagnosis not present

## 2019-09-17 DIAGNOSIS — I2510 Atherosclerotic heart disease of native coronary artery without angina pectoris: Secondary | ICD-10-CM | POA: Diagnosis not present

## 2019-09-17 DIAGNOSIS — I25110 Atherosclerotic heart disease of native coronary artery with unstable angina pectoris: Secondary | ICD-10-CM | POA: Diagnosis not present

## 2019-09-18 NOTE — Telephone Encounter (Signed)
Patient called back with household income. [place] renewed and info faxed to pharmacy.

## 2019-09-23 DIAGNOSIS — J310 Chronic rhinitis: Secondary | ICD-10-CM | POA: Diagnosis not present

## 2019-09-23 DIAGNOSIS — R040 Epistaxis: Secondary | ICD-10-CM | POA: Diagnosis not present

## 2019-09-28 DIAGNOSIS — K4090 Unilateral inguinal hernia, without obstruction or gangrene, not specified as recurrent: Secondary | ICD-10-CM | POA: Diagnosis not present

## 2019-09-30 DIAGNOSIS — E782 Mixed hyperlipidemia: Secondary | ICD-10-CM | POA: Diagnosis not present

## 2019-09-30 DIAGNOSIS — Z0000 Encounter for general adult medical examination without abnormal findings: Secondary | ICD-10-CM | POA: Diagnosis not present

## 2019-09-30 DIAGNOSIS — Z125 Encounter for screening for malignant neoplasm of prostate: Secondary | ICD-10-CM | POA: Diagnosis not present

## 2019-09-30 DIAGNOSIS — Z79899 Other long term (current) drug therapy: Secondary | ICD-10-CM | POA: Diagnosis not present

## 2019-09-30 DIAGNOSIS — N400 Enlarged prostate without lower urinary tract symptoms: Secondary | ICD-10-CM | POA: Diagnosis not present

## 2019-09-30 DIAGNOSIS — E559 Vitamin D deficiency, unspecified: Secondary | ICD-10-CM | POA: Diagnosis not present

## 2019-10-02 ENCOUNTER — Ambulatory Visit: Payer: Self-pay | Admitting: General Surgery

## 2019-10-02 ENCOUNTER — Telehealth: Payer: Self-pay | Admitting: *Deleted

## 2019-10-02 DIAGNOSIS — K4090 Unilateral inguinal hernia, without obstruction or gangrene, not specified as recurrent: Secondary | ICD-10-CM | POA: Diagnosis not present

## 2019-10-02 NOTE — Telephone Encounter (Signed)
Eric Pace 69 year old male last seen in the office 05/21/2019.  He was doing well at that time.  Contacted today 10/02/2019.  He continues to be very physically active walking 5-6 miles daily.  States he has lost around 40 pounds has been able to maintain his weight.  May we hold his Plavix prior to his inguinal hernia repair?   On 05/21/2019 , noted significant improvement in his chest symptoms after changing Ticagrelor to Clopidogrel.  He mainly felt chest discomfort with certain upper extremity movements.  However,  05/20/2019 he was able to move large amounts of mulch without discomfort.  He has not had significant shortness of breath, orthopnea, syncope, leg swelling.  Myoview 01/28/2019 No ischemia or prior infarction, EF 55%; Low Risk   Echocardiogram 01/28/2019 GLS -18.6%, EF 55-60, normal RVSF, mild LAE, trace MR, mild TR, mild AS (mean 10 mmHg)  Cardiac catheterization 09/18/2018 LAD proximal 90; D1 90 LCx proximal 40 RCA proximal stent patent PCI: 2.75 x 12 mm Resolute Onyx DES to the proximal LAD PCI: 2.25 x 12 mm Resolute Onyx DES to the D1  Please direct response to CV DIV preop pool.  Thank you for your help.  [name]. [facility] [place] [address] Office [phone] Fax [phone]

## 2019-10-02 NOTE — Telephone Encounter (Signed)
   [facility] Pre-operative Risk Assessment    HEARTCARE STAFF: - Please ensure there is not already an duplicate clearance open for this procedure. - Under Visit Info/Reason for Call, type in Other and utilize the format Clearance MM/DD/YY or Clearance TBD. Do not use dashes or single digits. - If request is for dental extraction, please clarify the # of teeth to be extracted.  Request for surgical clearance:  1. What type of surgery is being performed? ROBOTIC RIGHT INGUINAL HERNIA REPAIR WITH MESH   2. When is this surgery scheduled? TBD   3. What type of clearance is required (medical clearance vs. Pharmacy clearance to hold med vs. Both)? MEDICAL  4. Are there any medications that need to be held prior to surgery and how long? PLAVIX    5. Practice name and name of physician performing surgery? [facility]; DR. Rosendo Gros   6. What is the office phone number? [phone]   7.   What is the office fax number? Wise, CMA  8.   Anesthesia type (None, local, MAC, general) ? GENERAL   Julaine Hua 10/02/2019, 12:13 PM  _________________________________________________________________   (provider comments below)

## 2019-10-02 NOTE — Telephone Encounter (Signed)
Pt at low risk of holding plavix. Ok to do so for 5 days before inguinal hernia surgery if needed. thanks

## 2019-10-02 NOTE — H&P (Signed)
History of Present Illness Ralene Ok MD; 10/02/2019 11:19 AM) The patient is a 69 year old male who presents with an inguinal hernia. Referred by: Dr. Harrington Challenger Chief Complaint: Right inguinal hernia  Patient is a 70 year old male, who comes in with a right inguinal hernia. Patient is active and walks 5-6 miles a day, [place], has a history of MI approximately 1 year ago. Has 3 stents in place and is on Plavix. Patient has been followed up with Dr. Burt Knack his cardiologist. Patient states that the inguinal hernia has been causing him some discomfort and pain. He does bulge out usually when he is walking. He does have to minimize some heavy lifting. 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 He's had no signs or symptoms of incarceration or fibrillation.  He's had a previous open left inguinal hernia repair with mesh He's had no previous other abdominal surgeries.   Past Surgical History Darden Palmer, [place]; 10/02/2019 10:57 AM) Colon Polyp Removal - Open  Laparoscopic Inguinal Hernia Surgery  Left. Shoulder Surgery  Right.  Diagnostic Studies History Darden Palmer, [place]; 10/02/2019 10:57 AM) Colonoscopy  within last year  Allergies Darden Palmer, RMA; 10/02/2019 10:57 AM) No Known Drug Allergies  [10/02/2019]:  Medication History Darden Palmer, RMA; 10/02/2019 10:59 AM) HYDROcodone-Acetaminophen (5-325MG  Tablet, Oral) Active. Brilinta (90MG  Tablet, Oral) Active. Clopidogrel Bisulfate (75MG  Tablet, Oral) Active. Ezetimibe (10MG  Tablet, Oral) Active. Finasteride (5MG  Tablet, Oral) Active. Lisinopril (2.5MG  Tablet, Oral) Active. Metoprolol Succinate ER (25MG  Tablet ER 24HR, Oral) Active. Nitroglycerin (0.4MG  Tab Sublingual, Sublingual) Active. Mupirocin (2% Ointment, External) Active. Tamsulosin HCl (0.4MG  Capsule, Oral) Active.  Other Problems Darden Palmer, [place]; 10/02/2019 10:57 AM) Chest pain  Congestive Heart Failure  Enlarged  Prostate  Hemorrhoids  High blood pressure  Hypercholesterolemia  Inguinal Hernia  Myocardial infarction     Review of Systems Ralene Ok MD; 10/02/2019 11:18 AM) General Not Present- Appetite Loss, Chills, Fatigue, Fever, Night Sweats, Weight Gain and Weight Loss. Skin Not Present- Change in Wart/Mole, Dryness, Hives, Jaundice, New Lesions, Non-Healing Wounds, Rash and Ulcer. HEENT Present- Wears glasses/contact lenses. Not Present- Earache, Hearing Loss, Hoarseness, Nose Bleed, Oral Ulcers, Ringing in the Ears, Seasonal Allergies, Sinus Pain, Sore Throat, Visual Disturbances and Yellow Eyes. Respiratory Not Present- Bloody sputum, Chronic Cough, Difficulty Breathing, Snoring and Wheezing. Breast Not Present- Breast Mass, Breast Pain, Nipple Discharge and Skin Changes. Cardiovascular Not Present- Chest Pain, Difficulty Breathing Lying Down, Leg Cramps, Palpitations, Rapid Heart Rate, Shortness of Breath and Swelling of Extremities. Gastrointestinal Present- Abdominal Pain. Not Present- Bloating, Bloody Stool, Change in Bowel Habits, Chronic diarrhea, Constipation, Difficulty Swallowing, Excessive gas, Gets full quickly at meals, Hemorrhoids, Indigestion, Nausea, Rectal Pain and Vomiting. Male Genitourinary Not Present- Blood in Urine, Change in Urinary Stream, Frequency, Impotence, Nocturia, Painful Urination, Urgency and Urine Leakage. Musculoskeletal Not Present- Back Pain, Joint Pain, Joint Stiffness, Muscle Pain, Muscle Weakness and Swelling of Extremities. Neurological Not Present- Decreased Memory, Fainting, Headaches, Numbness, Seizures, Tingling, Tremor, Trouble walking and Weakness. Psychiatric Not Present- Anxiety, Bipolar, Change in Sleep Pattern, Depression, Fearful and Frequent crying. Endocrine Not Present- Cold Intolerance, Excessive Hunger, Hair Changes, Heat Intolerance, Hot flashes and New Diabetes. Hematology Present- Blood Thinners. Not Present- Easy Bruising,  Excessive bleeding, Gland problems, HIV and Persistent Infections. All other systems negative  Vitals Lattie Haw Caldwell RMA; 10/02/2019 11:00 AM) 10/02/2019 11:00 AM Weight: 186.13 lb Height: 69in Body Surface Area: 2 m Body Mass Index: 27.49 kg/m  Temp.: 98.49F  Pulse:  83 (Regular)  Resp.: 97 (Unlabored)  BP: 108/70(Sitting, Left Arm, Standard)       Physical Exam Ralene Ok MD; 10/02/2019 11:20 AM) The physical exam findings are as follows: Note: Constitutional: No acute distress, conversant, appears stated age  Eyes: Anicteric sclerae, moist conjunctiva, no lid lag  Neck: No thyromegaly, trachea midline, no cervical lymphadenopathy  Lungs: Clear to auscultation biilaterally, normal respiratory effot  Cardiovascular: regular rate & rhythm, no murmurs, no peripheal edema, pedal pulses 2+  GI: Soft, no masses or hepatosplenomegaly, non-tender to palpation  MSK: Normal gait, no clubbing cyanosis, edema  Skin: No rashes, palpation reveals normal skin turgor  Psychiatric: Appropriate judgment and insight, oriented to person, place, and time  Abdomen Inspection Hernias - Right - Inguinal hernia - Reducible - Right.    Assessment & Plan Ralene Ok MD; 10/02/2019 11:20 AM) RIGHT INGUINAL HERNIA (K40.90) Impression: 69 year old male with a right inguinal hernia  We'll obtain cardiac clearance and clearance to be off Plavix by Dr. Burt Knack. 1. The patient will like to proceed to the operating room for a robotic rightiknguinal hernia repair with mesh.  2. I discussed with the patient the signs and symptoms of incarceration and strangulation and the need to proceed to the ER should they occur.  3. I discussed with the patient the risks and benefits of the procedure to include but not limited to: Infection, bleeding, damage to surrounding structures, possible need for further surgery, possible nerve pain, and possible recurrence. The patient was  understanding and wishes to proceed.

## 2019-10-03 NOTE — Telephone Encounter (Signed)
   Primary Cardiologist: Sherren Mocha, MD  Chart reviewed and patient contacted 10/02/2019 by phone as part of pre-operative protocol coverage. Given past medical history and time since last visit, based on ACC/AHA guidelines, WEYMAN BOGDON would be at acceptable risk for the planned procedure without further cardiovascular testing.   Ok to hold Plavix 5 days pre op if needed.  We would prefer the patient stay on aspirin 81mg   But if this can be held as well if bleeding risk is prohibitive.  I will route this recommendation to the requesting party via Epic fax function and remove from pre-op pool.  Please call with questions.  Kerin Ransom, PA-C 10/03/2019, 8:23 AM

## 2019-11-13 NOTE — Pre-Procedure Instructions (Signed)
[facility] - [place], [place] - [address] [place] [address] Phone: [phone] Fax: [phone]    Your procedure is scheduled on Tues., Sept. 7, 2021 from 9:00AM-10:15AM  Report to [facility] Entrance "A" at 7:00AM  Call this number if you have problems the morning of surgery:  [phone]   Remember:  Do not eat after midnight on Sept. 6th  You may drink clear liquids until 3 hours (6:00AM) prior to surgery time. Clear liquids allowed are:  Water, Non-Citric juice (no pulp), Black coffee only (no creamer or dairy), Clear Tea (no creamer or dairy), Carbonated beverages, Gatorade, Plain Jell-O, and Plain Popsicles.    Take these medicines the morning of surgery with A SIP OF WATER: Aspirin     Ezetimibe (ZETIA) Metoprolol succinate (TOPROL-XL)  Tamsulosin (FLOMAX)       If Needed: NitroGLYCERIN (NITROSTAT)   Take last dose of Plavix on 11/13/19, per Dr. Burt Knack  As of today, STOP taking all Aspirin (unless instructed by your doctor) and Other Aspirin containing products, Vitamins, Fish oils, and Herbal medications. Also stop all NSAIDS i.e. Advil, Ibuprofen, Motrin, Aleve, Anaprox, Naproxen, BC, Goody Powders, and all Supplements.  No Smoking of any kind, Tobacco/Vaping, or Alcohol products 24 hours prior to your procedure. If you use a Cpap at night, you may bring all equipment for your overnight stay.   Special instructions: [facility]- Preparing For Surgery  Before surgery, you can play an important role. Because skin is not sterile, your skin needs to be as free of germs as possible. You can reduce the number of germs on your skin by washing with CHG (chlorahexidine gluconate) Soap before surgery.  CHG is an antiseptic cleaner which kills germs and bonds with the skin to continue killing germs even after washing.    Please do not use if you have an allergy to CHG or antibacterial soaps. If your skin  becomes reddened/irritated stop using the CHG.  Do not shave (including legs and underarms) for at least 48 hours prior to first CHG shower. It is OK to shave your face.  Please follow these instructions carefully.   1. Shower the NIGHT BEFORE SURGERY and the MORNING OF SURGERY with CHG.   2. If you chose to wash your hair, wash your hair first as usual with your normal shampoo.  3. After you shampoo, rinse your hair and body thoroughly to remove the shampoo.  4. Use CHG as you would any other liquid soap. You can apply CHG directly to the skin and wash gently with a scrungie or a clean washcloth.   5. Apply the CHG Soap to your body ONLY FROM THE NECK DOWN.  Do not use on open wounds or open sores. Avoid contact with your eyes, ears, mouth and genitals (private parts). Wash Face and genitals (private parts)  with your normal soap.  6. Wash thoroughly, paying special attention to the area where your surgery will be performed.  7. Thoroughly rinse your body with warm water from the neck down.  8. DO NOT shower/wash with your normal soap after using and rinsing off the CHG Soap.  9. Pat yourself dry with a CLEAN TOWEL.  10. Wear CLEAN PAJAMAS to bed the night before surgery, wear comfortable clothes the morning of surgery  11. Place CLEAN SHEETS on your bed the night of your first shower and DO NOT SLEEP WITH PETS.  Day of Surgery: Remember to  brush your teeth WITH YOUR REGULAR TOOTHPASTE.    Do not wear jewelry.  Do not wear lotions, powders, colognes, or deodorant.  Do not shave 48 hours prior to surgery.  Men may shave face and neck.  Do not bring valuables to the hospital.  [facility] is not responsible for any belongings or valuables.  Contacts, dentures or bridgework may not be worn into surgery.    For patients admitted to the hospital, discharge time will be determined by your treatment team.  Patients discharged the day of surgery will not be allowed to drive home, and  someone age 64 and over needs to stay with them for 24 hours.   Please wear clean clothes to the hospital/surgery center.    Please read over the following fact sheets that you were given.

## 2019-11-14 ENCOUNTER — Other Ambulatory Visit: Payer: Self-pay

## 2019-11-14 ENCOUNTER — Encounter (HOSPITAL_COMMUNITY)
Admission: RE | Admit: 2019-11-14 | Discharge: 2019-11-14 | Disposition: A | Discharge status: Home / Self Care | Payer: PPO | Source: Ambulatory Visit | Attending: General Surgery | Admitting: General Surgery

## 2019-11-14 ENCOUNTER — Encounter (HOSPITAL_COMMUNITY): Payer: Self-pay

## 2019-11-14 DIAGNOSIS — Z01812 Encounter for preprocedural laboratory examination: Secondary | ICD-10-CM | POA: Diagnosis not present

## 2019-11-14 HISTORY — DX: Other complications of anesthesia, initial encounter: T88.59XA

## 2019-11-14 HISTORY — DX: Acute myocardial infarction, unspecified: I21.9

## 2019-11-14 LAB — BASIC METABOLIC PANEL
Anion gap: 10 (ref 5–15)
BUN: 14 mg/dL (ref 8–23)
CO2: 27 mmol/L (ref 22–32)
Calcium: 9.7 mg/dL (ref 8.9–10.3)
Chloride: 102 mmol/L (ref 98–111)
Creatinine, Ser: 0.92 mg/dL (ref 0.61–1.24)
GFR calc Af Amer: >60 mL/min (ref >60)
GFR calc non Af Amer: >60 mL/min (ref >60)
Glucose, Bld: 98 mg/dL (ref 70–99)
Potassium: 4.3 mmol/L (ref 3.5–5.1)
Sodium: 139 mmol/L (ref 135–145)

## 2019-11-14 LAB — CBC
HCT: 46.2 % (ref 39.0–52.0)
Hemoglobin: 14.9 g/dL (ref 13.0–17.0)
MCH: 30.3 pg (ref 26.0–34.0)
MCHC: 32.3 g/dL (ref 30.0–36.0)
MCV: 93.9 fL (ref 80.0–100.0)
Platelets: 196 10*3/uL (ref 150–400)
RBC: 4.92 MIL/uL (ref 4.22–5.81)
RDW: 12.7 % (ref 11.5–15.5)
WBC: 4.8 10*3/uL (ref 4.0–10.5)
nRBC: 0 % (ref 0.0–0.2)

## 2019-11-14 NOTE — Progress Notes (Signed)
PCP - Dr. Lona Kettle Cardiologist - Dr. Sherren Mocha  Chest x-ray - n/a EKG - 01/18/19 Stress Test - 01/28/19 ECHO - 01/28/19 Cardiac Cath - 09/16/18  Sleep Study - denies CPAP - denies  Blood Thinner Instructions: Plavix-hold 5 days pre-op.  Aspirin Instructions: continue ASA  ERAS Protcol -Protocol initiated. Pt to stop clear liquids by 0600. PRE-SURGERY Ensure or G2- no drink ordered.   COVID TEST- Scheduled for 11/15/19 at 1455. Pt aware to quarantine after testing until DOS.   Anesthesia review: Yes, heart history.  Patient denies shortness of breath, fever, cough and chest pain at PAT appointment   All instructions explained to the patient, with a verbal understanding of the material. Patient agrees to go over the instructions while at home for a better understanding. Patient also instructed to self quarantine after being tested for COVID-19. The opportunity to ask questions was provided.    Coronavirus Screening  Have you experienced the following symptoms:  Cough yes/no: No Fever (>100.45F)  yes/no: No Runny nose yes/no: No Sore throat yes/no: No Difficulty breathing/shortness of breath  yes/no: No  Have you or a family member traveled in the last 14 days and where? yes/no: No   If the patient indicates "YES" to the above questions, their PAT will be rescheduled to limit the exposure to others and, the surgeon will be notified. THE PATIENT WILL NEED TO BE ASYMPTOMATIC FOR 14 DAYS.   If the patient is not experiencing any of these symptoms, the PAT nurse will instruct them to NOT bring anyone with them to their appointment since they may have these symptoms or traveled as well.   Please remind your patients and families that hospital visitation restrictions are in effect and the importance of the restrictions.

## 2019-11-15 ENCOUNTER — Other Ambulatory Visit (HOSPITAL_COMMUNITY)
Admission: RE | Admit: 2019-11-15 | Discharge: 2019-11-15 | Disposition: A | Discharge status: Home / Self Care | Payer: PPO | Source: Ambulatory Visit | Attending: General Surgery | Admitting: General Surgery

## 2019-11-15 DIAGNOSIS — Z20822 Contact with and (suspected) exposure to covid-19: Secondary | ICD-10-CM | POA: Insufficient documentation

## 2019-11-15 DIAGNOSIS — Z01812 Encounter for preprocedural laboratory examination: Secondary | ICD-10-CM | POA: Insufficient documentation

## 2019-11-15 LAB — SARS CORONAVIRUS 2 (TAT 6-24 HRS): SARS Coronavirus 2: NEGATIVE

## 2019-11-15 NOTE — Anesthesia Preprocedure Evaluation (Addendum)
Anesthesia Evaluation  Patient identified by MRN, date of birth, ID band Patient awake    Reviewed: Allergy & Precautions, NPO status , Patient's Chart, lab work & pertinent test results, reviewed documented beta blocker date and time   History of Anesthesia Complications (+) PROLONGED EMERGENCE and history of anesthetic complications  Airway Mallampati: I  TM Distance: >3 FB Neck ROM: Full    Dental no notable dental hx. (+) Missing, Dental Advisory Given, Chipped,    Pulmonary neg pulmonary ROS,    Pulmonary exam normal breath sounds clear to auscultation       Cardiovascular METS: 3 - Mets hypertension, Pt. on home beta blockers + CAD, + Past MI (09/2018) and + Cardiac Stents (DES RCA 09/2018, on plavix- LD 7 days ago)  Normal cardiovascular exam+ Valvular Problems/Murmurs (mild AS) AS  Rhythm:Regular Rate:Normal  a. cath 09/16/18 aspiration thrombectomy and DES to RCA b. cath 09/18/18  PCI of LAD adn 1st diag with stenting to both and PTCA of the diagonal ostium throught the LAD stent struts  Echo 01/2019:  (improved LVEF from MI in 09/2018) 1. The average left ventricular global longitudinal strain is normal at  -18.6 %.  2. Left ventricular ejection fraction, by visual estimation, is 55 to  60%. The left ventricle has normal function. There is no left ventricular  hypertrophy.  3. Global right ventricle has normal systolic function.The right  ventricular size is normal. No increase in right ventricular wall  thickness.  4. Left atrial size was mildly dilated.  5. Right atrial size was normal.  6. The mitral valve is normal in structure. Trace mitral valve  regurgitation. No evidence of mitral stenosis.  7. The tricuspid valve is normal in structure. Tricuspid valve  regurgitation is mild.  8. The aortic valve is tricuspid. There is Moderate thickening of the  aortic valve. There is Moderate calcification of the aortic  valve. Aortic  valve regurgitation is not visualized. Very mild aortic valve stenosis.  9. The pulmonic valve was normal in structure. Pulmonic valve  regurgitation is not visualized.  10. Normal pulmonary artery systolic pressure.  11. The inferior vena cava is normal in size with greater than 50%  respiratory variability, suggesting right atrial pressure of 3 mmHg.  12. There is Moderate thickening of the aortic valve.  13. There is Moderate calcification of the aortic valve.   Nuclear stress 01/28/19:  Nuclear stress EF: 55%.  There was no ST segment deviation noted during stress.  The study is normal.  This is a low risk study.  The left ventricular ejection fraction is normal (55-65%).  1. This is a normal myocardial perfusion imaging study without evidence of ischemia or prior infarction. 2. Normal left ventricular cavity. Normal left ventricular ejection fraction, 55% 3. This is a low risk study.   Neuro/Psych negative neurological ROS  negative psych ROS   GI/Hepatic Neg liver ROS, Right inguinal hernia   Endo/Other  negative endocrine ROS  Renal/GU negative Renal ROS  negative genitourinary   Musculoskeletal negative musculoskeletal ROS (+)   Abdominal Normal abdominal exam  (+)   Peds  Hematology negative hematology ROS (+) hct 46.2   Anesthesia Other Findings   Reproductive/Obstetrics negative OB ROS                           Anesthesia Physical Anesthesia Plan  ASA: III  Anesthesia Plan: General   Post-op Pain Management:    Induction:  Intravenous  PONV Risk Score and Plan: 2 and Ondansetron, Dexamethasone and Treatment may vary due to age or medical condition  Airway Management Planned: Oral ETT  Additional Equipment: None  Intra-op Plan:   Post-operative Plan: Extubation in OR  Informed Consent: I have reviewed the patients History and Physical, chart, labs and discussed the procedure including the  risks, benefits and alternatives for the proposed anesthesia with the patient or authorized representative who has indicated his/her understanding and acceptance.     Dental advisory given  Plan Discussed with: CRNA  Anesthesia Plan Comments:       Anesthesia Quick Evaluation

## 2019-11-15 NOTE — Progress Notes (Signed)
Anesthesia Chart Review:  Follows with cardiology for history of CAD.  Per cardiology note, "The patient presented in July 2020 with an acute inferoposterior MI treated with primary PCI of the right coronary artery.  He underwent staged PCI of the LAD and first diagonal branches during his index hospitalization with stenting of the LAD and first diagonal as well as angioplasty of the first diagonal ostium through the LAD stent struts.  LVEF initially was in the 40 to 45% range with inferoposterior akinesis."  Since that time he has had intermittent chest discomfort.  However, he continues to be quite active reportedly walking 5 to 6 miles daily. He underwent a Myoview November 2020 that showed no ischemia or prior infarction, EF 55%, low risk.  Last echo November 2020 showed EF 55-60%, mild AS with mean gradient 10 mmHg.  He was cleared by cardiology for surgery per telephone encounter 10/03/2019, "Chart reviewed and patient contacted 10/02/2019 by phone as part of pre-operative protocol coverage. Given past medical history and time since last visit, based on ACC/AHA guidelines, ELMOR KOST would be at acceptable risk for the planned procedure without further cardiovascular testing. Ok to hold Plavix 5 days pre op if needed.  We would prefer the patient stay on aspirin 81mg   But if this can be held as well if bleeding risk is prohibitive."  Preop labs reviewed, unremarkable.   EKG 01/18/19: NSR. T wave abnormality, consider inferior ischemia.   Nuclear stress 01/28/19:  Nuclear stress EF: 55%.  There was no ST segment deviation noted during stress.  The study is normal.  This is a low risk study.  The left ventricular ejection fraction is normal (55-65%).   1.  This is a normal myocardial perfusion imaging study without evidence of ischemia or prior infarction. 2.  Normal left ventricular cavity.  Normal left ventricular ejection fraction, 55% 3.  This is a low risk study.  TTE  01/28/19: 1. The average left ventricular global longitudinal strain is normal at  -18.6 %.  2. Left ventricular ejection fraction, by visual estimation, is 55 to  60%. The left ventricle has normal function. There is no left ventricular  hypertrophy.  3. Global right ventricle has normal systolic function.The right  ventricular size is normal. No increase in right ventricular wall  thickness.  4. Left atrial size was mildly dilated.  5. Right atrial size was normal.  6. The mitral valve is normal in structure. Trace mitral valve  regurgitation. No evidence of mitral stenosis.  7. The tricuspid valve is normal in structure. Tricuspid valve  regurgitation is mild.  8. The aortic valve is tricuspid. There is Moderate thickening of the  aortic valve. There is Moderate calcification of the aortic valve. Aortic  valve regurgitation is not visualized. Very mild aortic valve stenosis.  9. The pulmonic valve was normal in structure. Pulmonic valve  regurgitation is not visualized.  10. Normal pulmonary artery systolic pressure.  11. The inferior vena cava is normal in size with greater than 50%  respiratory variability, suggesting right atrial pressure of 3 mmHg.  12. There is Moderate thickening of the aortic valve.  13. There is Moderate calcification of the aortic valve.    Wynonia Musty [facility]/Anesthesiology Phone [phone] 11/15/2019 9:43 AM

## 2019-11-19 ENCOUNTER — Encounter (HOSPITAL_COMMUNITY): Payer: Self-pay | Admitting: General Surgery

## 2019-11-19 ENCOUNTER — Other Ambulatory Visit (HOSPITAL_COMMUNITY): Payer: PPO

## 2019-11-19 ENCOUNTER — Ambulatory Visit (HOSPITAL_COMMUNITY)
Admission: RE | Admit: 2019-11-19 | Discharge: 2019-11-19 | Disposition: A | Discharge status: Home / Self Care | Payer: PPO | Attending: General Surgery | Admitting: General Surgery

## 2019-11-19 ENCOUNTER — Ambulatory Visit (HOSPITAL_COMMUNITY): Payer: PPO | Admitting: Anesthesiology

## 2019-11-19 ENCOUNTER — Other Ambulatory Visit: Payer: Self-pay

## 2019-11-19 ENCOUNTER — Encounter (HOSPITAL_COMMUNITY)
Admission: RE | Disposition: A | Discharge status: Home / Self Care | Payer: Self-pay | Source: Home / Self Care | Attending: General Surgery

## 2019-11-19 ENCOUNTER — Ambulatory Visit (HOSPITAL_COMMUNITY): Payer: PPO | Admitting: Physician Assistant

## 2019-11-19 DIAGNOSIS — E7800 Pure hypercholesterolemia, unspecified: Secondary | ICD-10-CM | POA: Diagnosis not present

## 2019-11-19 DIAGNOSIS — N400 Enlarged prostate without lower urinary tract symptoms: Secondary | ICD-10-CM | POA: Diagnosis not present

## 2019-11-19 DIAGNOSIS — I252 Old myocardial infarction: Secondary | ICD-10-CM | POA: Diagnosis not present

## 2019-11-19 DIAGNOSIS — Z955 Presence of coronary angioplasty implant and graft: Secondary | ICD-10-CM | POA: Insufficient documentation

## 2019-11-19 DIAGNOSIS — Z7902 Long term (current) use of antithrombotics/antiplatelets: Secondary | ICD-10-CM | POA: Diagnosis not present

## 2019-11-19 DIAGNOSIS — I10 Essential (primary) hypertension: Secondary | ICD-10-CM | POA: Insufficient documentation

## 2019-11-19 DIAGNOSIS — K4090 Unilateral inguinal hernia, without obstruction or gangrene, not specified as recurrent: Secondary | ICD-10-CM | POA: Insufficient documentation

## 2019-11-19 DIAGNOSIS — I2109 ST elevation (STEMI) myocardial infarction involving other coronary artery of anterior wall: Secondary | ICD-10-CM | POA: Diagnosis not present

## 2019-11-19 DIAGNOSIS — I2510 Atherosclerotic heart disease of native coronary artery without angina pectoris: Secondary | ICD-10-CM | POA: Insufficient documentation

## 2019-11-19 DIAGNOSIS — E785 Hyperlipidemia, unspecified: Secondary | ICD-10-CM | POA: Diagnosis not present

## 2019-11-19 DIAGNOSIS — Z79899 Other long term (current) drug therapy: Secondary | ICD-10-CM | POA: Insufficient documentation

## 2019-11-19 HISTORY — PX: XI ROBOTIC ASSISTED INGUINAL HERNIA REPAIR WITH MESH: SHX6706

## 2019-11-19 SURGERY — REPAIR, HERNIA, INGUINAL, ROBOT-ASSISTED, LAPAROSCOPIC, USING MESH
Anesthesia: General | Site: Inguinal | Laterality: Right

## 2019-11-19 MED ORDER — ACETAMINOPHEN 500 MG PO TABS: 1000.0000 mg | ORAL_TABLET | Freq: Once | ORAL | Status: DC

## 2019-11-19 MED ORDER — ONDANSETRON HCL 4 MG/2ML IJ SOLN
INTRAMUSCULAR | Status: DC | PRN
  Administered 2019-11-19: 4 mg via INTRAVENOUS

## 2019-11-19 MED ORDER — EPHEDRINE SULFATE 50 MG/ML IJ SOLN
INTRAMUSCULAR | Status: DC | PRN
  Administered 2019-11-19: 5 mg via INTRAVENOUS

## 2019-11-19 MED ORDER — OXYCODONE HCL 5 MG PO TABS
5.0000 mg | ORAL_TABLET | Freq: Once | ORAL | Status: AC | PRN
  Administered 2019-11-19: 5 mg via ORAL

## 2019-11-19 MED ORDER — CEFAZOLIN SODIUM-DEXTROSE 2-4 GM/100ML-% IV SOLN
2.0000 g | INTRAVENOUS | Status: AC
  Administered 2019-11-19: 2 g via INTRAVENOUS

## 2019-11-19 MED ORDER — LIDOCAINE 2% (20 MG/ML) 5 ML SYRINGE
INTRAMUSCULAR | Status: DC | PRN
  Administered 2019-11-19: 60 mg via INTRAVENOUS

## 2019-11-19 MED ORDER — NORMAL SALINE FLUSH 0.9 % IV SOLN
INTRAVENOUS | Status: DC | PRN
  Administered 2019-11-19: 20 mL

## 2019-11-19 MED ORDER — HYDROMORPHONE HCL 1 MG/ML IJ SOLN: 0.2500 mg | INTRAMUSCULAR | Status: DC | PRN

## 2019-11-19 MED ORDER — ACETAMINOPHEN 500 MG PO TABS
ORAL_TABLET | ORAL | Status: AC
  Administered 2019-11-19: 1000 mg via ORAL
  Filled 2019-11-19: qty 2

## 2019-11-19 MED ORDER — CHLORHEXIDINE GLUCONATE CLOTH 2 % EX PADS: 6.0000 | MEDICATED_PAD | Freq: Once | CUTANEOUS | Status: DC

## 2019-11-19 MED ORDER — CHLORHEXIDINE GLUCONATE 0.12 % MT SOLN: 15.0000 mL | Freq: Once | OROMUCOSAL | Status: AC

## 2019-11-19 MED ORDER — OXYCODONE HCL 5 MG PO TABS
ORAL_TABLET | ORAL | Status: AC
  Filled 2019-11-19: qty 1

## 2019-11-19 MED ORDER — CEFAZOLIN SODIUM-DEXTROSE 2-4 GM/100ML-% IV SOLN
INTRAVENOUS | Status: AC
  Filled 2019-11-19: qty 100

## 2019-11-19 MED ORDER — ONDANSETRON HCL 4 MG/2ML IJ SOLN
INTRAMUSCULAR | Status: AC
  Filled 2019-11-19: qty 2

## 2019-11-19 MED ORDER — FENTANYL CITRATE (PF) 250 MCG/5ML IJ SOLN
INTRAMUSCULAR | Status: AC
  Filled 2019-11-19: qty 5

## 2019-11-19 MED ORDER — ONDANSETRON HCL 4 MG/2ML IJ SOLN
4.0000 mg | Freq: Once | INTRAMUSCULAR | Status: AC | PRN
  Administered 2019-11-19: 4 mg via INTRAVENOUS

## 2019-11-19 MED ORDER — SUGAMMADEX SODIUM 200 MG/2ML IV SOLN
INTRAVENOUS | Status: DC | PRN
  Administered 2019-11-19: 200 mg via INTRAVENOUS

## 2019-11-19 MED ORDER — ORAL CARE MOUTH RINSE: 15.0000 mL | Freq: Once | OROMUCOSAL | Status: AC

## 2019-11-19 MED ORDER — BUPIVACAINE HCL (PF) 0.25 % IJ SOLN
INTRAMUSCULAR | Status: AC
  Filled 2019-11-19: qty 30

## 2019-11-19 MED ORDER — TRAMADOL HCL 50 MG PO TABS: 50.0000 mg | ORAL_TABLET | Freq: Four times a day (QID) | ORAL | 0 refills | Status: AC | PRN

## 2019-11-19 MED ORDER — PROPOFOL 10 MG/ML IV BOLUS
INTRAVENOUS | Status: AC
  Filled 2019-11-19: qty 40

## 2019-11-19 MED ORDER — BUPIVACAINE LIPOSOME 1.3 % IJ SUSP
INTRAMUSCULAR | Status: DC | PRN
  Administered 2019-11-19: 20 mL

## 2019-11-19 MED ORDER — BUPIVACAINE-EPINEPHRINE 0.25% -1:200000 IJ SOLN
INTRAMUSCULAR | Status: DC | PRN
  Administered 2019-11-19: 7 mL

## 2019-11-19 MED ORDER — LACTATED RINGERS IV SOLN: INTRAVENOUS | Status: DC

## 2019-11-19 MED ORDER — BUPIVACAINE-EPINEPHRINE 0.5% -1:200000 IJ SOLN
INTRAMUSCULAR | Status: AC
  Filled 2019-11-19: qty 1

## 2019-11-19 MED ORDER — ACETAMINOPHEN 500 MG PO TABS: 1000.0000 mg | ORAL_TABLET | ORAL | Status: AC

## 2019-11-19 MED ORDER — PROPOFOL 10 MG/ML IV BOLUS
INTRAVENOUS | Status: DC | PRN
  Administered 2019-11-19: 130 mg via INTRAVENOUS

## 2019-11-19 MED ORDER — CHLORHEXIDINE GLUCONATE 0.12 % MT SOLN
OROMUCOSAL | Status: AC
  Administered 2019-11-19: 15 mL via OROMUCOSAL
  Filled 2019-11-19: qty 15

## 2019-11-19 MED ORDER — FENTANYL CITRATE (PF) 250 MCG/5ML IJ SOLN
INTRAMUSCULAR | Status: DC | PRN
Start: 2019-11-19 — End: 2019-11-19
  Administered 2019-11-19: 50 ug via INTRAVENOUS

## 2019-11-19 MED ORDER — DEXAMETHASONE SODIUM PHOSPHATE 10 MG/ML IJ SOLN
INTRAMUSCULAR | Status: DC | PRN
  Administered 2019-11-19: 5 mg via INTRAVENOUS

## 2019-11-19 MED ORDER — ROCURONIUM BROMIDE 10 MG/ML (PF) SYRINGE
PREFILLED_SYRINGE | INTRAVENOUS | Status: DC | PRN
  Administered 2019-11-19: 50 mg via INTRAVENOUS
  Administered 2019-11-19: 20 mg via INTRAVENOUS
  Administered 2019-11-19: 50 mg via INTRAVENOUS

## 2019-11-19 MED ORDER — ENSURE PRE-SURGERY PO LIQD: 296.0000 mL | Freq: Once | ORAL | Status: DC

## 2019-11-19 MED ORDER — BUPIVACAINE-EPINEPHRINE (PF) 0.25% -1:200000 IJ SOLN
INTRAMUSCULAR | Status: AC
  Filled 2019-11-19: qty 30

## 2019-11-19 MED ORDER — 0.9 % SODIUM CHLORIDE (POUR BTL) OPTIME
TOPICAL | Status: DC | PRN
  Administered 2019-11-19: 1000 mL

## 2019-11-19 MED ORDER — OXYCODONE HCL 5 MG/5ML PO SOLN: 5.0000 mg | Freq: Once | ORAL | Status: AC | PRN

## 2019-11-19 SURGICAL SUPPLY — 66 items
CANNULA REDUC XI 12-8 STAPL (CANNULA)
CANNULA REDUC XI 12-8MM STAPL (CANNULA)
CANNULA REDUCER 12-8 DVNC XI (CANNULA) IMPLANT
CHLORAPREP W/TINT 26 (MISCELLANEOUS) ×3 IMPLANT
COVER MAYO STAND STRL (DRAPES) ×3 IMPLANT
COVER SURGICAL LIGHT HANDLE (MISCELLANEOUS) ×3 IMPLANT
COVER TIP SHEARS 8 DVNC (MISCELLANEOUS) ×1 IMPLANT
COVER TIP SHEARS 8MM DA VINCI (MISCELLANEOUS) ×3
DECANTER SPIKE VIAL GLASS SM (MISCELLANEOUS) ×3 IMPLANT
DEFOGGER SCOPE WARMER CLEARIFY (MISCELLANEOUS) ×3 IMPLANT
DERMABOND ADVANCED (GAUZE/BANDAGES/DRESSINGS) ×2
DERMABOND ADVANCED .7 DNX12 (GAUZE/BANDAGES/DRESSINGS) ×1 IMPLANT
DEVICE TROCAR PUNCTURE CLOSURE (ENDOMECHANICALS) ×3 IMPLANT
DRAPE ARM DVNC X/XI (DISPOSABLE) ×4 IMPLANT
DRAPE COLUMN DVNC XI (DISPOSABLE) ×1 IMPLANT
DRAPE CV SPLIT W-CLR ANES SCRN (DRAPES) ×3 IMPLANT
DRAPE DA VINCI XI ARM (DISPOSABLE) ×12
DRAPE DA VINCI XI COLUMN (DISPOSABLE) ×3
DRAPE ORTHO SPLIT 77X108 STRL (DRAPES) ×3
DRAPE SURG ORHT 6 SPLT 77X108 (DRAPES) ×1 IMPLANT
ELECT REM PT RETURN 9FT ADLT (ELECTROSURGICAL) ×3
ELECTRODE REM PT RTRN 9FT ADLT (ELECTROSURGICAL) ×1 IMPLANT
GLOVE BIO SURGEON STRL SZ7.5 (GLOVE) ×6 IMPLANT
GLOVE BIOGEL PI IND STRL 6.5 (GLOVE) ×3 IMPLANT
GLOVE BIOGEL PI INDICATOR 6.5 (GLOVE) ×6
GLOVE SURG SS PI 6.5 STRL IVOR (GLOVE) ×3 IMPLANT
GOWN STRL REUS W/ TWL LRG LVL3 (GOWN DISPOSABLE) ×2 IMPLANT
GOWN STRL REUS W/ TWL XL LVL3 (GOWN DISPOSABLE) ×2 IMPLANT
GOWN STRL REUS W/TWL 2XL LVL3 (GOWN DISPOSABLE) ×3 IMPLANT
GOWN STRL REUS W/TWL LRG LVL3 (GOWN DISPOSABLE) ×6
GOWN STRL REUS W/TWL XL LVL3 (GOWN DISPOSABLE) ×6
KIT BASIN OR (CUSTOM PROCEDURE TRAY) ×3 IMPLANT
KIT TURNOVER KIT B (KITS) ×3 IMPLANT
MARKER SKIN DUAL TIP RULER LAB (MISCELLANEOUS) ×3 IMPLANT
MESH PROGRIP LAP SELF FIXATING (Mesh General) ×3 IMPLANT
MESH PROGRIP LAP SLF FIX 16X12 (Mesh General) ×1 IMPLANT
NEEDLE HYPO 22GX1.5 SAFETY (NEEDLE) ×3 IMPLANT
NEEDLE INSUFFLATION 14GA 120MM (NEEDLE) ×3 IMPLANT
NS IRRIG 1000ML POUR BTL (IV SOLUTION) ×3 IMPLANT
OBTURATOR OPTICAL STANDARD 8MM (TROCAR)
OBTURATOR OPTICAL STND 8 DVNC (TROCAR)
OBTURATOR OPTICALSTD 8 DVNC (TROCAR) IMPLANT
PAD ARMBOARD 7.5X6 YLW CONV (MISCELLANEOUS) ×6 IMPLANT
PENCIL SMOKE EVACUATOR (MISCELLANEOUS) IMPLANT
SCISSORS LAP 5X35 DISP (ENDOMECHANICALS) IMPLANT
SEAL CANN UNIV 5-8 DVNC XI (MISCELLANEOUS) ×3 IMPLANT
SEAL XI 5MM-8MM UNIVERSAL (MISCELLANEOUS) ×9
SET IRRIG TUBING LAPAROSCOPIC (IRRIGATION / IRRIGATOR) IMPLANT
SET TUBE SMOKE EVAC HIGH FLOW (TUBING) ×3 IMPLANT
STAPLER CANNULA SEAL DVNC XI (STAPLE) IMPLANT
STAPLER CANNULA SEAL XI (STAPLE)
STOPCOCK 4 WAY LG BORE MALE ST (IV SETS) ×3 IMPLANT
SUT MNCRL AB 4-0 PS2 18 (SUTURE) ×3 IMPLANT
SUT VIC AB 2-0 SH 27 (SUTURE) ×3
SUT VIC AB 2-0 SH 27X BRD (SUTURE) ×1 IMPLANT
SUT VLOC 180 2-0 9IN GS21 (SUTURE) ×3 IMPLANT
SYR 30ML SLIP (SYRINGE) ×3 IMPLANT
SYR 50ML LL SCALE MARK (SYRINGE) ×3 IMPLANT
SYR TOOMEY 50ML (SYRINGE) ×3 IMPLANT
TOWEL GREEN STERILE (TOWEL DISPOSABLE) ×3 IMPLANT
TOWEL GREEN STERILE FF (TOWEL DISPOSABLE) ×3 IMPLANT
TRAY FOLEY MTR SLVR 14FR STAT (SET/KITS/TRAYS/PACK) ×3 IMPLANT
TRAY FOLEY MTR SLVR 16FR STAT (SET/KITS/TRAYS/PACK) IMPLANT
TRAY LAPAROSCOPIC MC (CUSTOM PROCEDURE TRAY) ×3 IMPLANT
TROCAR XCEL NON-BLD 5MMX100MML (ENDOMECHANICALS) IMPLANT
WATER STERILE IRR 1000ML POUR (IV SOLUTION) ×3 IMPLANT

## 2019-11-19 NOTE — Op Note (Signed)
11/19/2019  10:35 AM  PATIENT:  Eric Pace  69 y.o. male  PRE-OPERATIVE DIAGNOSIS:  right inguinal hernia  POST-OPERATIVE DIAGNOSIS: Large right indirect inguinal hernia  PROCEDURE:  Procedure(s): ROBOTIC RIGHT INGUINAL HERNIA REPAIR WITH MESH USING COVIDIEN PROGRIP SELF-FIXATING MESH 16cm x 12cm (Right)  SURGEON:  Surgeon(s) and Role:    Ralene Ok, MD - Primary  ANESTHESIA:   local and general  EBL:  minimal   BLOOD ADMINISTERED:none  DRAINS: none   LOCAL MEDICATIONS USED:  BUPIVICAINE   SPECIMEN:  No Specimen  DISPOSITION OF SPECIMEN:  N/A  COUNTS:  YES  TOURNIQUET:  * No tourniquets in log *  DICTATION: .Dragon Dictation Details of the procedure:   The patient was taken back to the operating room. The patient was placed in supine position with bilateral SCDs in place.  A Foley catheter was placed.  The patient was prepped and draped in the usual sterile fashion.  After appropriate anitbiotics were confirmed, a time-out was confirmed and all facts were verified.  At this time a Veress needle technique was used to inspect the abdomen approximately 10 cm from the valgus and the paramedian line. This time a 8 mm robotic trocar was placed into the abdomen. The camera was placed there was no injury to any intra-abdominal organs. A 69mm umbilical port was placed just superior to the umbilicus. An 8 mm port was placed approximately 10 cm lateral to the umbilicus on the left paramedian side. At this time the right-sided Progrip 16x12cm mesh was placed in the abdomen as were 3-0 Vicryl's x1 and a 2-0 V-Lock.  Robot was positioned over the patient and the ports were docked in the usual fashion.  At this time the right-sided peritoneum was taken down from the medial umbilical ligament laterally. The pre-peritoneal space was entered. Dissection was taken down to Cooper's ligament. At this time it was apparent there was a large indirect hernia.  At this time proceeded clean  out the rest Cooper's ligament and the medial to lateral direction. A proceeded laterally to dissect the spermatic cord. The spermatic cord was circumferentially dissected away from the surrounding musculature and tissue. The vas deferens was identified and protected all portions of the case. There was a large indirect hernia. This was dissected back. At this time I proceeded to create a pocket laterally for the mesh. Once the pocket was created the peritoneum was stripped back to approximately the base of the cord. At this time the piece of right-sided Progrip mesh was in placed into the dissected area. This covered both the direct and indirect spaces appropriately. This also covered the femoral space. At this time the 3-0 Vicryl stitches were then used to tack theMesh to the Cooper's ligament 1 medially. The mesh lay flat from medial to lateral. At this time a 2-0 V-lock stitch was used to close the peritoneum in a standard running fashion.  At this time the robot was undocked. The umbilical port site was reapproximated using a 0 Vicryl via an Endo Close device 1. All ports were removed. The skin was reapproximated and all port sites using 4-0 Monocryl subcuticular fashion.  The patient the procedure well was taken to the recovery   PLAN OF CARE: Discharge to home after PACU  PATIENT DISPOSITION:  PACU - hemodynamically stable.   Delay start of Pharmacological VTE agent (>24hrs) due to surgical blood loss or risk of bleeding: not applicable

## 2019-11-19 NOTE — Transfer of Care (Signed)
Immediate Anesthesia Transfer of Care Note  Patient: Eric Pace  Procedure(s) Performed: ROBOTIC RIGHT INGUINAL HERNIA REPAIR WITH MESH USING COVIDIEN PROGRIP SELF-FIXATING MESH 16cm x 12cm (Right Inguinal)  Patient Location: PACU  Anesthesia Type:General  Level of Consciousness: awake, alert , oriented, patient cooperative and responds to stimulation  Airway & Oxygen Therapy: Patient Spontanous Breathing and Patient connected to face mask oxygen  Post-op Assessment: Report given to RN and Post -op Vital signs reviewed and stable  Post vital signs: Reviewed and stable  Last Vitals:  Vitals Value Taken Time  BP    Temp    Pulse    Resp    SpO2      Last Pain:  Vitals:   11/19/19 0755  TempSrc: Oral  PainSc:       Patients Stated Pain Goal: 3 (58/31/67 4255)  Complications: No complications documented.

## 2019-11-19 NOTE — H&P (Signed)
History of Present Illness The patient is a 69 year old male who presents with an inguinal hernia. Referred by: Dr. Harrington Challenger Chief Complaint: Right inguinal hernia  Patient is a 69 year old male, who comes in with a right inguinal hernia. Patient is active and walks 5-6 miles a day, [place], has a history of MI approximately 1 year ago. Has 3 stents in place and is on Plavix. Patient has been followed up with Dr. Burt Knack his cardiologist. Patient states that the inguinal hernia has been causing him some discomfort and pain. He does bulge out usually when he is walking. He does have to minimize some heavy lifting. 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 He's had no signs or symptoms of incarceration or fibrillation.  He's had a previous open left inguinal hernia repair with mesh He's had no previous other abdominal surgeries.   Past Surgical History  Colon Polyp Removal - Open  Laparoscopic Inguinal Hernia Surgery  Left. Shoulder Surgery  Right.  Diagnostic Studies History Colonoscopy  within last year  Allergies  No Known Drug Allergies  [10/02/2019]:  Medication History HYDROcodone-Acetaminophen (5-325MG  Tablet, Oral) Active. Brilinta (90MG  Tablet, Oral) Active. Clopidogrel Bisulfate (75MG  Tablet, Oral) Active. Ezetimibe (10MG  Tablet, Oral) Active. Finasteride (5MG  Tablet, Oral) Active. Lisinopril (2.5MG  Tablet, Oral) Active. Metoprolol Succinate ER (25MG  Tablet ER 24HR, Oral) Active. Nitroglycerin (0.4MG  Tab Sublingual, Sublingual) Active. Mupirocin (2% Ointment, External) Active. Tamsulosin HCl (0.4MG  Capsule, Oral) Active.  Other Problems Chest pain  Congestive Heart Failure  Enlarged Prostate  Hemorrhoids  High blood pressure  Hypercholesterolemia  Inguinal Hernia  Myocardial infarction     Review of Systems  General Not Present- Appetite Loss, Chills, Fatigue, Fever, Night Sweats, Weight Gain and Weight  Loss. Skin Not Present- Change in Wart/Mole, Dryness, Hives, Jaundice, New Lesions, Non-Healing Wounds, Rash and Ulcer. HEENT Present- Wears glasses/contact lenses. Not Present- Earache, Hearing Loss, Hoarseness, Nose Bleed, Oral Ulcers, Ringing in the Ears, Seasonal Allergies, Sinus Pain, Sore Throat, Visual Disturbances and Yellow Eyes. Respiratory Not Present- Bloody sputum, Chronic Cough, Difficulty Breathing, Snoring and Wheezing. Breast Not Present- Breast Mass, Breast Pain, Nipple Discharge and Skin Changes. Cardiovascular Not Present- Chest Pain, Difficulty Breathing Lying Down, Leg Cramps, Palpitations, Rapid Heart Rate, Shortness of Breath and Swelling of Extremities. Gastrointestinal Present- Abdominal Pain. Not Present- Bloating, Bloody Stool, Change in Bowel Habits, Chronic diarrhea, Constipation, Difficulty Swallowing, Excessive gas, Gets full quickly at meals, Hemorrhoids, Indigestion, Nausea, Rectal Pain and Vomiting. Male Genitourinary Not Present- Blood in Urine, Change in Urinary Stream, Frequency, Impotence, Nocturia, Painful Urination, Urgency and Urine Leakage. Musculoskeletal Not Present- Back Pain, Joint Pain, Joint Stiffness, Muscle Pain, Muscle Weakness and Swelling of Extremities. Neurological Not Present- Decreased Memory, Fainting, Headaches, Numbness, Seizures, Tingling, Tremor, Trouble walking and Weakness. Psychiatric Not Present- Anxiety, Bipolar, Change in Sleep Pattern, Depression, Fearful and Frequent crying. Endocrine Not Present- Cold Intolerance, Excessive Hunger, Hair Changes, Heat Intolerance, Hot flashes and New Diabetes. Hematology Present- Blood Thinners. Not Present- Easy Bruising, Excessive bleeding, Gland problems, HIV and Persistent Infections. All other systems negative    Physical Exam  The physical exam findings are as follows: Note: Constitutional: No acute distress, conversant, appears stated age  Eyes: Anicteric sclerae, moist  conjunctiva, no lid lag  Neck: No thyromegaly, trachea midline, no cervical lymphadenopathy  Lungs: Clear to auscultation biilaterally, normal respiratory effot  Cardiovascular: regular rate & rhythm, no murmurs, no peripheal edema, pedal pulses 2+  GI: Soft, no masses or hepatosplenomegaly,  non-tender to palpation  MSK: Normal gait, no clubbing cyanosis, edema  Skin: No rashes, palpation reveals normal skin turgor  Psychiatric: Appropriate judgment and insight, oriented to person, place, and time  Abdomen Inspection Hernias - Right - Inguinal hernia - Reducible - Right.    Assessment & Plan  RIGHT INGUINAL HERNIA (K40.90) Impression: 69 year old male with a right inguinal hernia  We'll obtain cardiac clearance and clearance to be off Plavix by Dr. Burt Knack. 1. The patient will like to proceed to the operating room for a robotic rightiknguinal hernia repair with mesh.  2. I discussed with the patient the signs and symptoms of incarceration and strangulation and the need to proceed to the ER should they occur.  3. I discussed with the patient the risks and benefits of the procedure to include but not limited to: Infection, bleeding, damage to surrounding structures, possible need for further surgery, possible nerve pain, and possible recurrence. The patient was understanding and wishes to proceed.

## 2019-11-19 NOTE — Discharge Instructions (Signed)
[facility] _______[facility] °INGUINAL HERNIA REPAIR: POST OP INSTRUCTIONS ° °Always review your discharge instruction sheet given to you by the facility where your surgery was performed. °IF YOU HAVE DISABILITY OR FAMILY LEAVE FORMS, YOU MUST BRING THEM TO THE OFFICE FOR PROCESSING.   °DO NOT GIVE THEM TO YOUR DOCTOR. ° °1. A  prescription for pain medication may be given to you upon discharge.  Take your pain medication as prescribed, if needed.  If narcotic pain medicine is not needed, then you may take acetaminophen (Tylenol) or ibuprofen (Advil) as needed. °2. Take your usually prescribed medications unless otherwise directed. °If you need a refill on your pain medication, please contact your pharmacy.  They will contact our office to request authorization. Prescriptions will not be filled after 5 pm or on week-ends. °3. You should follow a light diet the first 24 hours after arrival home, such as soup and crackers, etc.  Be sure to include lots of fluids daily.  Resume your normal diet the day after surgery. °4.Most patients will experience some swelling and bruising around the umbilicus or in the groin and scrotum.  Ice packs and reclining will help.  Swelling and bruising can take several days to resolve.  °6. It is common to experience some constipation if taking pain medication after surgery.  Increasing fluid intake and taking a stool softener (such as Colace) will usually help or prevent this problem from occurring.  A mild laxative (Milk of Magnesia or Miralax) should be taken according to package directions if there are no bowel movements after 48 hours. °7. Unless discharge instructions indicate otherwise, you may remove your bandages 24-48 hours after surgery, and you may shower at that time.  You may have steri-strips (small skin tapes) in place directly over the incision.  These strips should be left on the skin for 7-10 days.  If your surgeon used skin glue on the incision, you may  shower in 24 hours.  The glue will flake off over the next 2-3 weeks.  Any sutures or staples will be removed at the office during your follow-up visit. °8. ACTIVITIES:  You may resume regular (light) daily activities beginning the next day--such as daily self-care, walking, climbing stairs--gradually increasing activities as tolerated.  You may have sexual intercourse when it is comfortable.  Refrain from any heavy lifting or straining until approved by your doctor. ° °a.You may drive when you are no longer taking prescription pain medication, you can comfortably wear a seatbelt, and you can safely maneuver your car and apply brakes. °b.RETURN TO WORK:   °_____________________________________________ ° °9.You should see your doctor in the office for a follow-up appointment approximately 2-3 weeks after your surgery.  Make sure that you call for this appointment within a day or two after you arrive home to insure a convenient appointment time. °10.OTHER INSTRUCTIONS: _________________________ °   _____________________________________ ° °WHEN TO CALL YOUR DOCTOR: °1. Fever over 101.0 °2. Inability to urinate °3. Nausea and/or vomiting °4. Extreme swelling or bruising °5. Continued bleeding from incision. °6. Increased pain, redness, or drainage from the incision ° °The clinic staff is available to answer your questions during regular business hours.  Please don’t hesitate to call and ask to speak to one of the nurses for clinical concerns.  If you have a medical emergency, go to the nearest emergency room or call 911.  A surgeon from [facility] is always on call at the hospital ° ° °[address], [address], [place], [place]  [address] ? ° [address] [address], [place], [place]   [address] °[phone] ? [phone] ? FAX [phone] °Web site: [email or web address] ° °

## 2019-11-19 NOTE — Anesthesia Procedure Notes (Signed)
Procedure Name: Intubation Performed by: Glynda Jaeger, CRNA Pre-anesthesia Checklist: Patient identified, Emergency Drugs available, Suction available and Patient being monitored Patient Re-evaluated:Patient Re-evaluated prior to induction Oxygen Delivery Method: Circle System Utilized Preoxygenation: Pre-oxygenation with 100% oxygen Induction Type: IV induction Ventilation: Mask ventilation without difficulty Laryngoscope Size: Miller and 2 Grade View: Grade I Tube type: Oral Tube size: 7.5 mm Number of attempts: 1 Airway Equipment and Method: Stylet Placement Confirmation: ETT inserted through vocal cords under direct vision,  positive ETCO2 and breath sounds checked- equal and bilateral Tube secured with: Tape Dental Injury: Teeth and Oropharynx as per pre-operative assessment

## 2019-11-19 NOTE — OR Nursing (Signed)
100 cc of sterile water injected into the foley prior to removal by M. Eloisa Northern, RN per MD.

## 2019-11-19 NOTE — Anesthesia Postprocedure Evaluation (Signed)
Anesthesia Post Note  Patient: Eric Pace  Procedure(s) Performed: ROBOTIC RIGHT INGUINAL HERNIA REPAIR WITH MESH USING COVIDIEN PROGRIP SELF-FIXATING MESH 16cm x 12cm (Right Inguinal)     Patient location during evaluation: PACU Anesthesia Type: General Level of consciousness: awake and alert, oriented and patient cooperative Pain management: pain level controlled Vital Signs Assessment: post-procedure vital signs reviewed and stable Respiratory status: spontaneous breathing, nonlabored ventilation and respiratory function stable Cardiovascular status: blood pressure returned to baseline and stable Postop Assessment: no apparent nausea or vomiting Anesthetic complications: no   No complications documented.  Last Vitals:  Vitals:   11/19/19 1105 11/19/19 1120  BP: 125/81 125/73  Pulse: 63 64  Resp: 11 13  Temp:  (!) 36.2 C  SpO2: 98% 95%    Last Pain:  Vitals:   11/19/19 1120  TempSrc:   PainSc: 0-No pain                 Pervis Hocking

## 2019-11-20 ENCOUNTER — Encounter (HOSPITAL_COMMUNITY): Payer: Self-pay | Admitting: General Surgery

## 2019-11-21 MED FILL — Bupivacaine Liposome Inj 1.3% (13.3 MG/ML): INTRAMUSCULAR | Qty: 20 | Status: AC

## 2019-11-21 MED FILL — Bupivacaine Liposome Inj 1.3% (13.3 MG/ML): INTRAMUSCULAR | Qty: 20 | Status: CN

## 2019-12-10 ENCOUNTER — Other Ambulatory Visit: Payer: Self-pay

## 2019-12-10 ENCOUNTER — Encounter: Payer: Self-pay | Admitting: Physician Assistant

## 2019-12-10 ENCOUNTER — Ambulatory Visit: Payer: PPO | Admitting: Physician Assistant

## 2019-12-10 VITALS — BP 136/60 | HR 67 | Ht 71.0 in | Wt 193.0 lb

## 2019-12-10 DIAGNOSIS — E785 Hyperlipidemia, unspecified: Secondary | ICD-10-CM | POA: Diagnosis not present

## 2019-12-10 DIAGNOSIS — I10 Essential (primary) hypertension: Secondary | ICD-10-CM | POA: Diagnosis not present

## 2019-12-10 DIAGNOSIS — I25119 Atherosclerotic heart disease of native coronary artery with unspecified angina pectoris: Secondary | ICD-10-CM

## 2019-12-10 NOTE — Patient Instructions (Signed)
Medication Instructions:  Your physician recommends that you continue on your current medications as directed. Please refer to the Current Medication list given to you today.  *If you need a refill on your cardiac medications before your next appointment, please call your pharmacy*  Lab Work: None ordered today  If you have labs (blood work) drawn today and your tests are completely normal, you will receive your results only by: Marland Kitchen MyChart Message (if you have MyChart) OR . A paper copy in the mail If you have any lab test that is abnormal or we need to change your treatment, we will call you to review the results.  Testing/Procedures: None ordered today  Follow-Up: At [facility], you and your health needs are our priority.  As part of our continuing mission to provide you with exceptional heart care, we have created designated Provider Care Teams.  These Care Teams include your primary Cardiologist (physician) and Advanced Practice Providers (APPs -  Physician Assistants and Nurse Practitioners) who all work together to provide you with the care you need, when you need it.  Your next appointment:   12 month(s)  The format for your next appointment:   In Person  Provider:   You may see Sherren Mocha, MD or Richardson Dopp, PA-C

## 2019-12-10 NOTE — Progress Notes (Signed)
Cardiology Office Note:    Date:  12/10/2019   ID:  Eric Pace, DOB 01/14/1951, MRN [id number]  PCP:  [name], MD  [facility] Cardiologist:  Sherren Mocha, MD   [facility] Electrophysiologist:  None   Referring MD: [name], MD   Chief Complaint:  Follow-up (CAD)    Patient Profile:    Eric Pace is a 69 y.o. male with:   Coronary artery disease  ? S/p inf-post STEMI in 09/2018 >> DES to RCA ? S/p staged PCI 09/2018:  DES to LAD, DES to Dx ? Myoview 01/2019: no ischemia  HFrEF 2/2 Ischemic CM, w/ return of normal EF ? EF 09/2018: 40-45 ? ACEi stopped due to low BP 01/2019 ? Echocardiogram 01/2019: EF 55-60  Hypertension   Hyperlipidemia  ? Intol of statins ? Rx w/ Evolocumab, Ezetimibe  Prior CV studies: Myoview 01/28/2019 No ischemia or prior infarction, EF 55%; Low Risk   Echocardiogram 01/28/2019 GLS -18.6%, EF 55-60, normal RVSF, mild LAE, trace MR, mild TR, mild AS (mean 10 mmHg)  Cardiac catheterization 09/18/2018 LAD proximal 90; D1 90 LCx proximal 40 RCA proximal stent patent PCI: 2.75 x 12 mm Resolute Onyx DES to the proximal LAD PCI: 2.25 x 12 mm Resolute Onyx DES to the D1  Echo 09/17/2018 EF 40-45, moderate LVH, inferoseptal/inferior/inferolateral HK, normal RV SF  Cardiac catheterization 09/16/2018 LAD proximal 90; D1 90 LCx proximal 40 RCA proximal 99 (ulcerative and heavily thrombotic) EF 45-50 PCI: 4 x 30 mm Resolute Onyx DES to the proximal RCA  History of Present Illness:    Mr. Eric Pace was last seen in 05/2019.  Recently, he underwent right inguinal hernia repair 11/19/2019.  He returns for follow-up.  He is overall doing well.  He has not had chest pain, shortness of breath, syncope, orthopnea, leg swelling.        Past Medical History:  Diagnosis Date  . CAD (coronary artery disease)    a. cath 09/16/18 aspiration thrombectomy and DES to RCA b. cath 09/18/18  PCI of LAD adn 1st diag with stenting to both  and PTCA of the diagonal ostium throught the LAD stent struts  . Chronic systolic CHF (congestive heart failure) ([place])   . Complication of anesthesia    hard to wake up.   . Essential hypertension   . Mixed hyperlipidemia   . Myocardial infarction ([place])    09/16/18    Current Medications: Current Meds  Medication Sig  . aspirin EC 81 MG EC tablet Take 1 tablet (81 mg total) by mouth daily.  . calcium carbonate (TUMS) 500 MG chewable tablet Chew 500 mg by mouth daily as needed for indigestion or heartburn.   . cholecalciferol (VITAMIN D3) 25 MCG (1000 UT) tablet Take 2,000 Units by mouth daily.   . clopidogrel (PLAVIX) 75 MG tablet Take 1 tablet (75 mg total) by mouth daily.  . Evolocumab (REPATHA SURECLICK) 242 MG/ML SOAJ Inject 1 pen into the skin every 14 (fourteen) days.  Marland Kitchen ezetimibe (ZETIA) 10 MG tablet TAKE ONE TABLET BY MOUTH EVERY MORNING  . metoprolol succinate (TOPROL-XL) 25 MG 24 hr tablet TAKE ONE TABLET BY MOUTH EVERY MORNING  . mometasone (ELOCON) 0.1 % cream Apply 1 application topically daily as needed (irritation).   . Multiple Vitamins-Minerals (PRESERVISION AREDS 2) CAPS Take 1 capsule by mouth in the morning and at bedtime.   . nitroGLYCERIN (NITROSTAT) 0.4 MG SL tablet Place 1 tablet (0.4 mg total) under the  tongue every 5 (five) minutes x 3 doses as needed for chest pain.  . tamsulosin (FLOMAX) 0.4 MG CAPS capsule Take 0.4 mg by mouth daily after breakfast.   . traMADol (ULTRAM) 50 MG tablet Take 1 tablet (50 mg total) by mouth every 6 (six) hours as needed.     Allergies:   Statins   Social History   Tobacco Use  . Smoking status: Never Smoker  . Smokeless tobacco: Never Used  Vaping Use  . Vaping Use: Never used  Substance Use Topics  . Alcohol use: Not Currently  . Drug use: Never     Family Hx: The patient's family history is not on file. He was adopted.  Review of Systems  Gastrointestinal: Negative for hematochezia and melena.      EKGs/Labs/Other Test Reviewed:    EKG:  EKG is   ordered today.  The ekg ordered today demonstrates normal sinus rhythm, HR 67, normal axis, non-specific ST-TW changes, QTc 426 ms  Recent Labs: 01/07/2019: ALT 12 11/14/2019: BUN 14; Creatinine, Ser 0.92; Hemoglobin 14.9; Platelets 196; Potassium 4.3; Sodium 139   Recent Lipid Panel from PCP personally reviewed and interpreted (KPN Tool): 09/30/2019: Total cholesterol 97, HDL 40, LDL 42, triglycerides 67   Physical Exam:    VS:  BP 136/60   Pulse 67   Ht 5\' 11"  (1.803 m)   Wt 193 lb (87.5 kg)   SpO2 98%   BMI 26.92 kg/m     Wt Readings from Last 3 Encounters:  12/10/19 193 lb (87.5 kg)  11/19/19 183 lb (83 kg)  11/14/19 189 lb 8 oz (86 kg)     Constitutional:      Appearance: Healthy appearance. Not in distress.  Neck:     Vascular: No carotid bruit. JVD normal.  Pulmonary:     Effort: Pulmonary effort is normal.     Breath sounds: No wheezing. No rales.  Cardiovascular:     Normal rate. Regular rhythm. Normal S1. Normal S2.     Murmurs: There is no murmur.  Edema:    Peripheral edema absent.  Abdominal:     Palpations: Abdomen is soft.  Skin:    General: Skin is warm and dry.  Neurological:     General: No focal deficit present.     Mental Status: Alert and oriented to person, place and time.     Cranial Nerves: Cranial nerves are intact.       ASSESSMENT & PLAN:    1. Coronary artery disease involving native coronary artery of native heart with angina pectoris [facility]) Status post inferior myocardial infarction in July 2020 treated with a drug-eluting stent to the RCA.  He underwent staged PCI with a drug-eluting stent to the LAD and a drug-eluting stent to the diagonal.  Myoview in 01/2019 was low risk.  He is doing well without angina.  Continue ASA, Clopidogrel, Metoprolol succinate and Evolocumab.  FU in 1 year.   2. Essential hypertension BP at home is usually optimal.  He has some unsweetened tea before  coming in today.  The extra caffeine likely explains his elevated BP.  I asked him to keep an eye on his BP and let us know if it is above goal.  Continue current dose of metoprolol succinate.   3. Hyperlipidemia LDL goal <70 LDL optimal on most recent lab work.  Continue current Rx.       Dispo:  Return in about 1 year (around 12/09/2020) for Routine Follow Up,  w/ Dr. Burt Knack, or Richardson Dopp, PA-C, in person.   Medication Adjustments/Labs and Tests Ordered: Current medicines are reviewed at length with the patient today.  Concerns regarding medicines are outlined above.  Tests Ordered: Orders Placed This Encounter  Procedures  . EKG 12-Lead   Medication Changes: No orders of the defined types were placed in this encounter.   Signed, Richardson Dopp, PA-C  12/10/2019 4:38 PM    [facility] [address], [place], [place]  [address] Phone: [phone]; Fax: [phone]

## 2019-12-16 DIAGNOSIS — I2111 ST elevation (STEMI) myocardial infarction involving right coronary artery: Secondary | ICD-10-CM | POA: Diagnosis not present

## 2019-12-16 DIAGNOSIS — N400 Enlarged prostate without lower urinary tract symptoms: Secondary | ICD-10-CM | POA: Diagnosis not present

## 2019-12-16 DIAGNOSIS — I2510 Atherosclerotic heart disease of native coronary artery without angina pectoris: Secondary | ICD-10-CM | POA: Diagnosis not present

## 2019-12-16 DIAGNOSIS — I10 Essential (primary) hypertension: Secondary | ICD-10-CM | POA: Diagnosis not present

## 2019-12-16 DIAGNOSIS — E782 Mixed hyperlipidemia: Secondary | ICD-10-CM | POA: Diagnosis not present

## 2019-12-16 DIAGNOSIS — I25110 Atherosclerotic heart disease of native coronary artery with unstable angina pectoris: Secondary | ICD-10-CM | POA: Diagnosis not present

## 2019-12-16 DIAGNOSIS — E785 Hyperlipidemia, unspecified: Secondary | ICD-10-CM | POA: Diagnosis not present

## 2019-12-30 DIAGNOSIS — R5383 Other fatigue: Secondary | ICD-10-CM | POA: Diagnosis not present

## 2019-12-30 DIAGNOSIS — R059 Cough, unspecified: Secondary | ICD-10-CM | POA: Diagnosis not present

## 2019-12-30 DIAGNOSIS — U071 COVID-19: Secondary | ICD-10-CM | POA: Diagnosis not present

## 2019-12-30 DIAGNOSIS — R509 Fever, unspecified: Secondary | ICD-10-CM | POA: Diagnosis not present

## 2019-12-31 ENCOUNTER — Other Ambulatory Visit (HOSPITAL_COMMUNITY): Payer: Self-pay

## 2019-12-31 ENCOUNTER — Ambulatory Visit (HOSPITAL_COMMUNITY)
Admission: RE | Admit: 2019-12-31 | Discharge: 2019-12-31 | Disposition: A | Discharge status: Home / Self Care | Payer: Medicare Other | Source: Ambulatory Visit | Attending: Pulmonary Disease | Admitting: Pulmonary Disease

## 2019-12-31 ENCOUNTER — Other Ambulatory Visit: Payer: Self-pay | Admitting: Nurse Practitioner

## 2019-12-31 DIAGNOSIS — E669 Obesity, unspecified: Secondary | ICD-10-CM

## 2019-12-31 DIAGNOSIS — I270 Primary pulmonary hypertension: Secondary | ICD-10-CM | POA: Diagnosis present

## 2019-12-31 DIAGNOSIS — Z23 Encounter for immunization: Secondary | ICD-10-CM | POA: Insufficient documentation

## 2019-12-31 DIAGNOSIS — I2119 ST elevation (STEMI) myocardial infarction involving other coronary artery of inferior wall: Secondary | ICD-10-CM

## 2019-12-31 DIAGNOSIS — I2111 ST elevation (STEMI) myocardial infarction involving right coronary artery: Secondary | ICD-10-CM

## 2019-12-31 DIAGNOSIS — U071 COVID-19: Secondary | ICD-10-CM | POA: Diagnosis present

## 2019-12-31 MED ORDER — FAMOTIDINE IN NACL 20-0.9 MG/50ML-% IV SOLN: 20.0000 mg | Freq: Once | INTRAVENOUS | Status: DC | PRN

## 2019-12-31 MED ORDER — DIPHENHYDRAMINE HCL 50 MG/ML IJ SOLN: 50.0000 mg | Freq: Once | INTRAMUSCULAR | Status: DC | PRN

## 2019-12-31 MED ORDER — SODIUM CHLORIDE 0.9 % IV SOLN: Freq: Once | INTRAVENOUS | Status: AC

## 2019-12-31 MED ORDER — SODIUM CHLORIDE 0.9 % IV SOLN: INTRAVENOUS | Status: DC | PRN

## 2019-12-31 MED ORDER — ALBUTEROL SULFATE HFA 108 (90 BASE) MCG/ACT IN AERS: 2.0000 | INHALATION_SPRAY | Freq: Once | RESPIRATORY_TRACT | Status: DC | PRN

## 2019-12-31 MED ORDER — METHYLPREDNISOLONE SODIUM SUCC 125 MG IJ SOLR: 125.0000 mg | Freq: Once | INTRAMUSCULAR | Status: DC | PRN

## 2019-12-31 MED ORDER — EPINEPHRINE 0.3 MG/0.3ML IJ SOAJ: 0.3000 mg | Freq: Once | INTRAMUSCULAR | Status: DC | PRN

## 2019-12-31 NOTE — Progress Notes (Signed)
I connected by phone with Eric Pace on 12/31/2019 at 1:50 PM to discuss the potential use of a new treatment for mild to moderate COVID-19 viral infection in non-hospitalized patients.  This patient is a 69 y.o. male that meets the FDA criteria for Emergency Use Authorization of COVID monoclonal antibody casirivimab/imdevimab or bamlanivimab/eteseviamb.  Has a (+) direct SARS-CoV-2 viral test result  Has mild or moderate COVID-19   Is NOT hospitalized due to COVID-19  Is within 10 days of symptom onset  Has at least one of the high risk factor(s) for progression to severe COVID-19 and/or hospitalization as defined in EUA.  Specific high risk criteria : Older age (>/= 70 yo), BMI > 25 and Cardiovascular disease or hypertension   I have spoken and communicated the following to the patient or parent/caregiver regarding COVID monoclonal antibody treatment:  1. FDA has authorized the emergency use for the treatment of mild to moderate COVID-19 in adults and pediatric patients with positive results of direct SARS-CoV-2 viral testing who are 25 years of age and older weighing at least 40 kg, and who are at high risk for progressing to severe COVID-19 and/or hospitalization.  2. The significant known and potential risks and benefits of COVID monoclonal antibody, and the extent to which such potential risks and benefits are unknown.  3. Information on available alternative treatments and the risks and benefits of those alternatives, including clinical trials.  4. Patients treated with COVID monoclonal antibody should continue to self-isolate and use infection control measures (e.g., wear mask, isolate, social distance, avoid sharing personal items, clean and disinfect "high touch" surfaces, and frequent handwashing) according to CDC guidelines.   5. The patient or parent/caregiver has the option to accept or refuse COVID monoclonal antibody treatment.  After reviewing this information with  the patient, the patient has agreed to receive one of the available covid 19 monoclonal antibodies and will be provided an appropriate fact sheet prior to infusion. [name], NP 12/31/2019 1:50 PM

## 2019-12-31 NOTE — Discharge Instructions (Signed)

## 2019-12-31 NOTE — Progress Notes (Signed)
  Diagnosis: COVID-19  Physician:Dr wright  Procedure: [facility] Infusion [facility] Med: bamlanivimab\etesevimab infusion - Provided patient with bamlanimivab\etesevimab fact sheet for patients, parents and caregivers prior to infusion.  Complications: No immediate complications noted.  Discharge: Discharged home   [place], [place] 12/31/2019

## 2020-01-19 IMAGING — US US EXTREM LOW VENOUS*R*
1 series · 13 of 24 positions shown · non-contrast
Comparison: None.

CLINICAL DATA: Right leg pain.



[Series 1: us extrem low venous*right* · 13 of 60 slices shown]
[im 1/60]
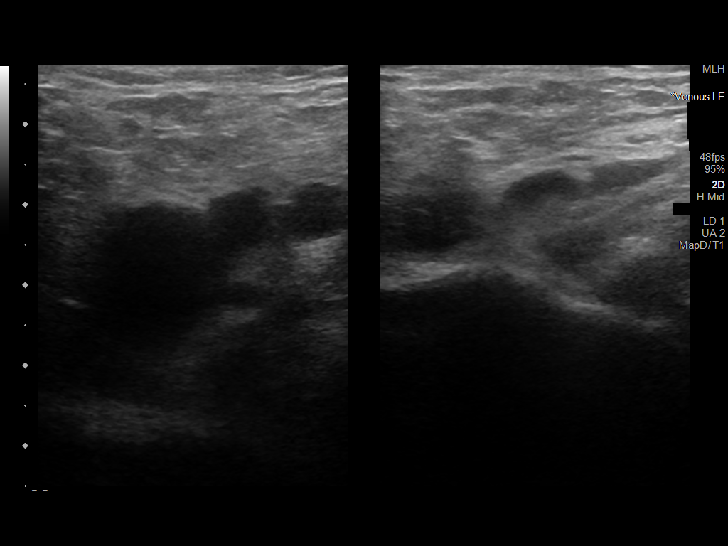
[im 6/60]
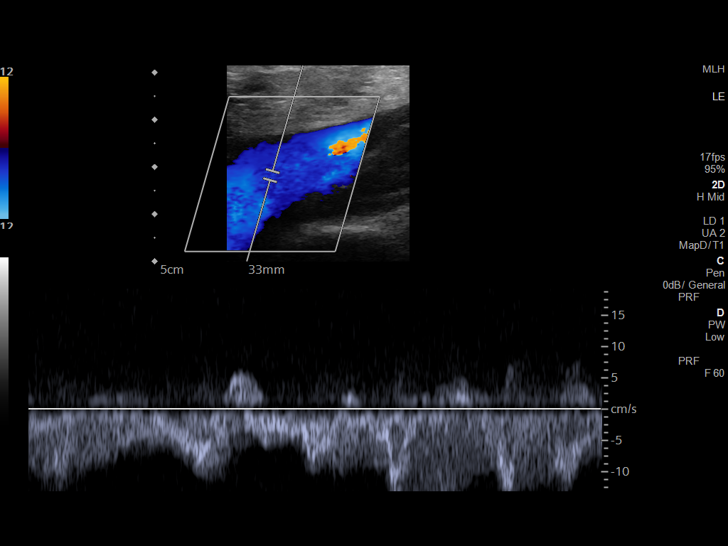
[im 11/60]
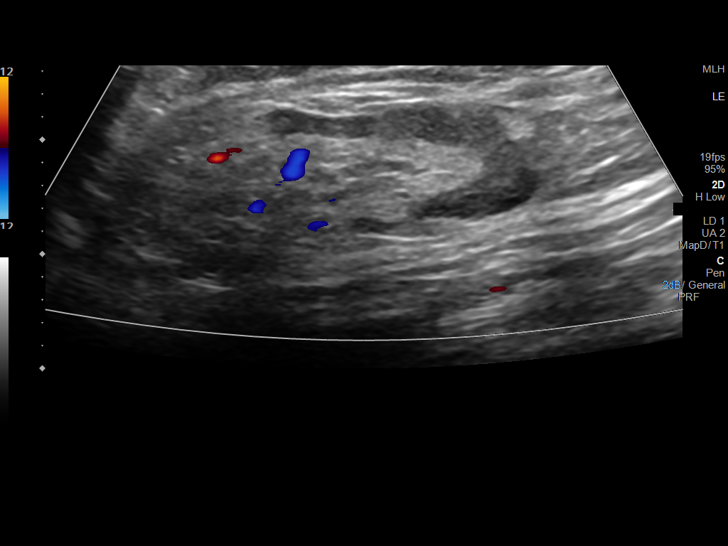
[im 16/60]
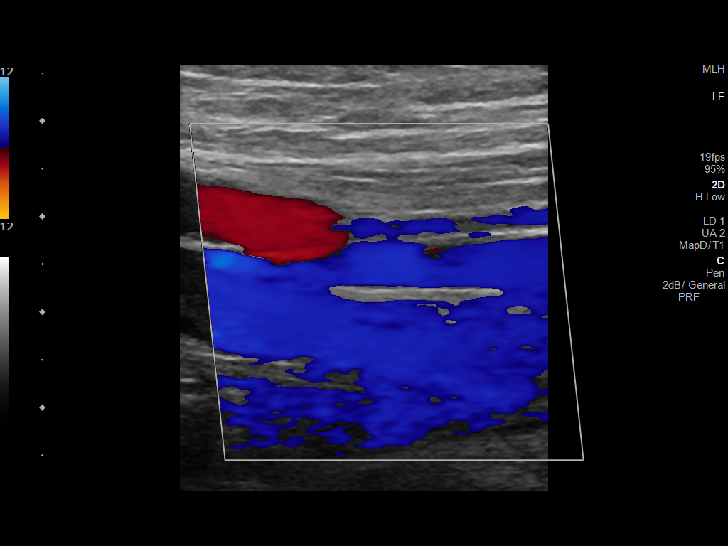
[im 21/60]
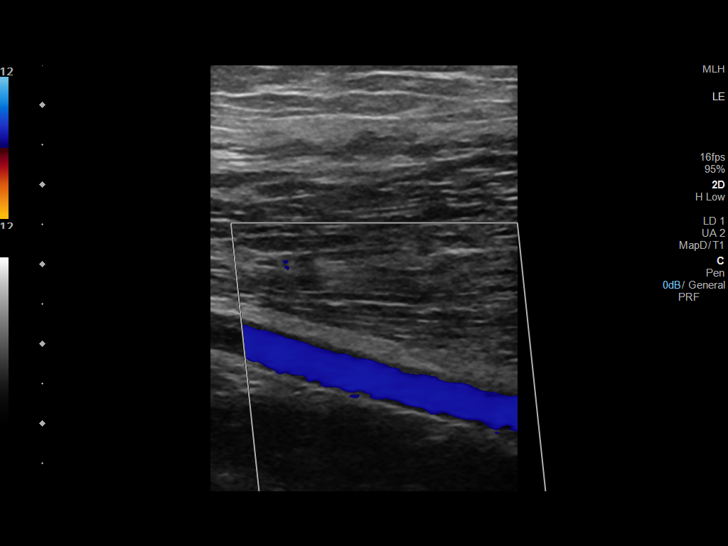
[im 26/60]
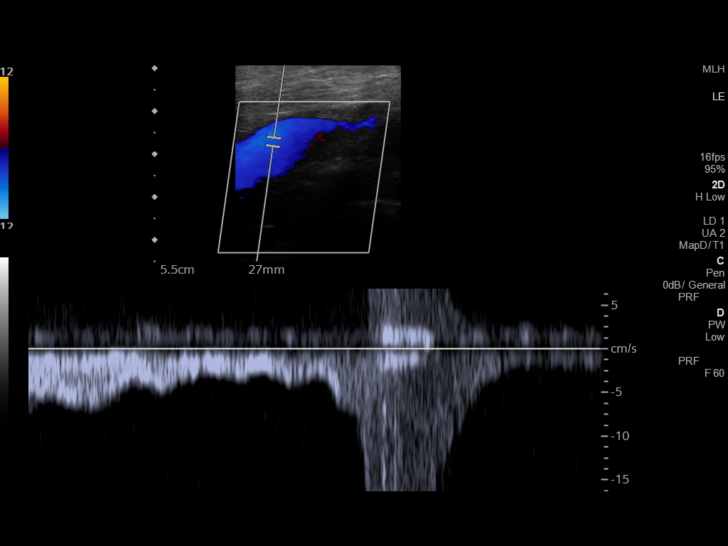
[im 31/60]
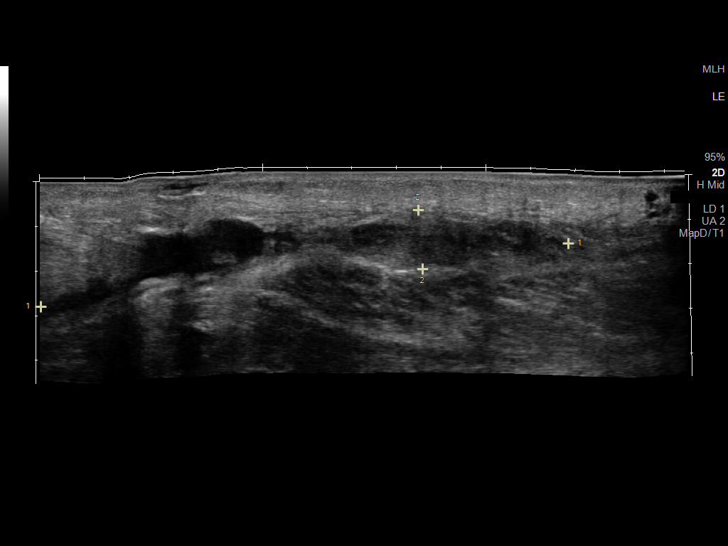
[im 34/60]
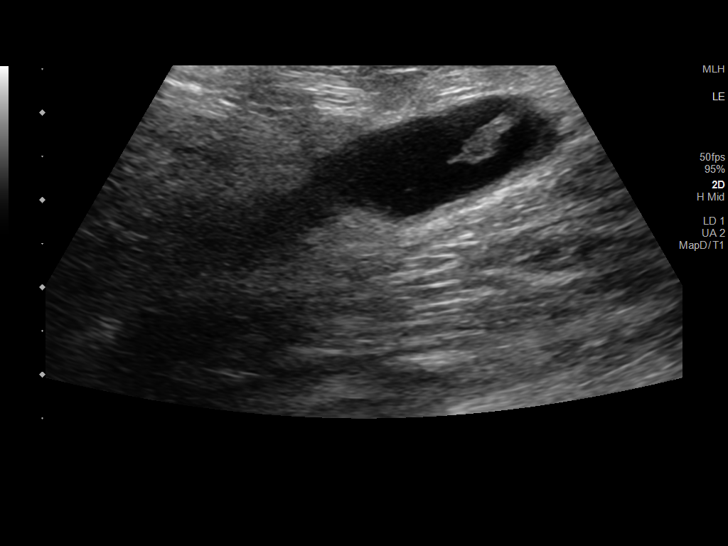
[im 39/60]
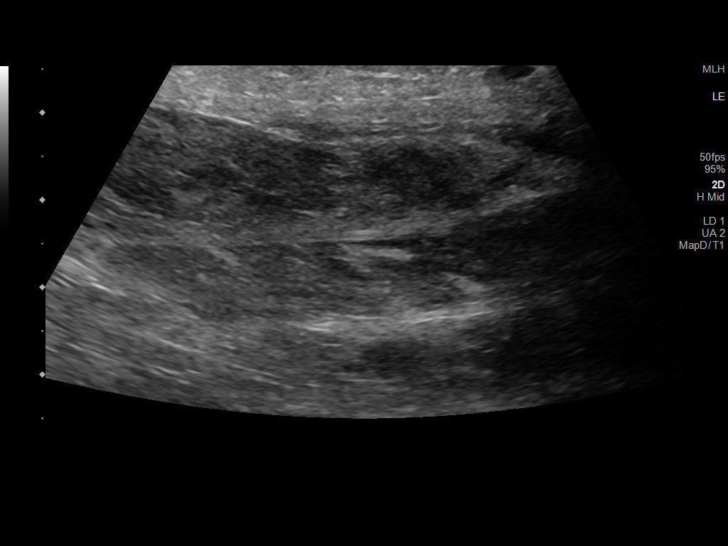
[im 44/60]
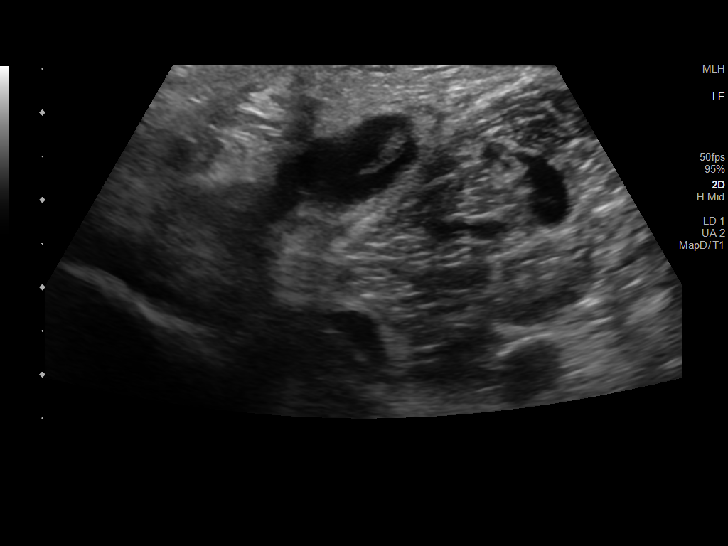
[im 49/60]
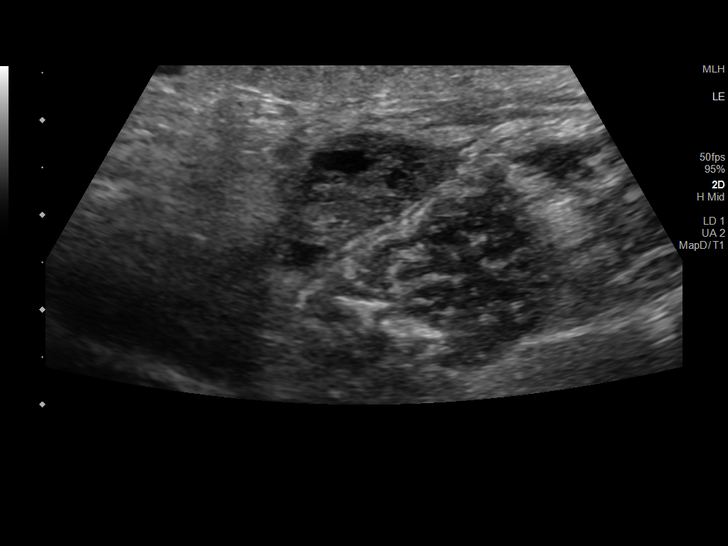
[im 54/60]
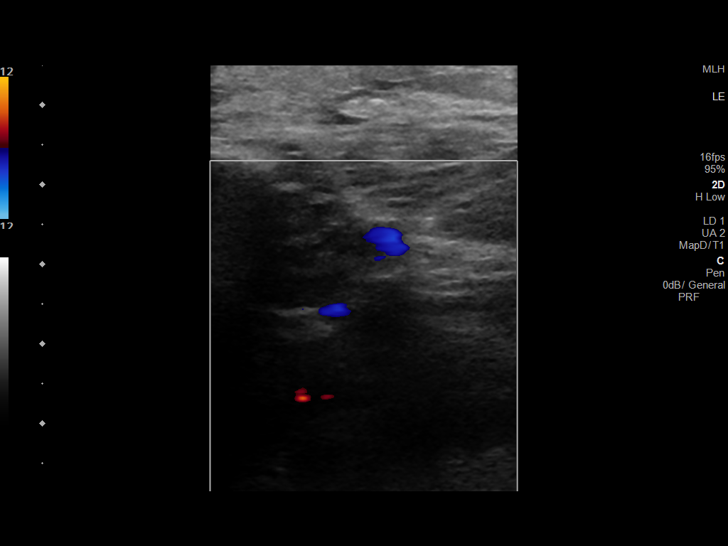
[im 60/60]
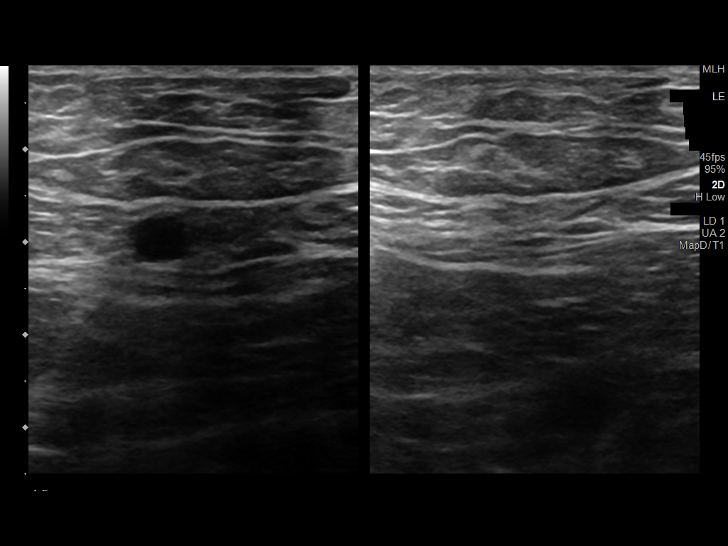

[13 of 24 positions shown; findings below may reference images not displayed]

FINDINGS: Contralateral Common Femoral Vein: Respiratory phasicity is normal
and symmetric with the symptomatic side. No evidence of thrombus.
Normal compressibility.

Common Femoral Vein: No evidence of thrombus. Normal
compressibility, respiratory phasicity and response to augmentation.

Saphenofemoral Junction: No evidence of thrombus. Normal
compressibility and flow on color Doppler imaging.

Profunda Femoral Vein: No evidence of thrombus. Normal
compressibility and flow on color Doppler imaging.

Femoral Vein: No evidence of thrombus. Normal compressibility,
respiratory phasicity and response to augmentation.

Popliteal Vein: No evidence of thrombus. Normal compressibility,
respiratory phasicity and response to augmentation.

Calf Veins: No evidence of thrombus. Normal compressibility and flow
on color Doppler imaging.

Superficial Great Saphenous Vein: No evidence of thrombus. Normal
compressibility.

Venous Reflux:  None.

Other Findings: There is a 12.2 x 1.8 x 1.3 cm heterogeneous cystic
area in the popliteal fossa extending into the proximal calf along
the gastrocnemius muscle. This is consistent with a dissecting
Baker's cyst. There is a prominent reactive lymph node in the right
inguinal region measuring 4.2 cm maximum diameter.
IMPRESSION: No evidence of deep venous thrombosis. Probable complex dissecting
Baker's cyst in the right popliteal fossa and calf.

## 2020-02-04 DIAGNOSIS — N400 Enlarged prostate without lower urinary tract symptoms: Secondary | ICD-10-CM | POA: Diagnosis not present

## 2020-02-04 DIAGNOSIS — I2510 Atherosclerotic heart disease of native coronary artery without angina pectoris: Secondary | ICD-10-CM | POA: Diagnosis not present

## 2020-02-04 DIAGNOSIS — E782 Mixed hyperlipidemia: Secondary | ICD-10-CM | POA: Diagnosis not present

## 2020-02-04 DIAGNOSIS — I10 Essential (primary) hypertension: Secondary | ICD-10-CM | POA: Diagnosis not present

## 2020-02-04 DIAGNOSIS — I25110 Atherosclerotic heart disease of native coronary artery with unstable angina pectoris: Secondary | ICD-10-CM | POA: Diagnosis not present

## 2020-02-04 DIAGNOSIS — E785 Hyperlipidemia, unspecified: Secondary | ICD-10-CM | POA: Diagnosis not present

## 2020-02-04 DIAGNOSIS — I2111 ST elevation (STEMI) myocardial infarction involving right coronary artery: Secondary | ICD-10-CM | POA: Diagnosis not present

## 2020-02-28 MED ORDER — METOPROLOL SUCCINATE ER 25 MG PO TB24: 12.5000 mg | ORAL_TABLET | Freq: Every day | ORAL | 3 refills | Status: DC

## 2020-03-04 DIAGNOSIS — I25110 Atherosclerotic heart disease of native coronary artery with unstable angina pectoris: Secondary | ICD-10-CM | POA: Diagnosis not present

## 2020-03-04 DIAGNOSIS — E785 Hyperlipidemia, unspecified: Secondary | ICD-10-CM | POA: Diagnosis not present

## 2020-03-04 DIAGNOSIS — E782 Mixed hyperlipidemia: Secondary | ICD-10-CM | POA: Diagnosis not present

## 2020-03-04 DIAGNOSIS — I10 Essential (primary) hypertension: Secondary | ICD-10-CM | POA: Diagnosis not present

## 2020-03-04 DIAGNOSIS — I2510 Atherosclerotic heart disease of native coronary artery without angina pectoris: Secondary | ICD-10-CM | POA: Diagnosis not present

## 2020-03-04 DIAGNOSIS — I2111 ST elevation (STEMI) myocardial infarction involving right coronary artery: Secondary | ICD-10-CM | POA: Diagnosis not present

## 2020-03-04 DIAGNOSIS — N400 Enlarged prostate without lower urinary tract symptoms: Secondary | ICD-10-CM | POA: Diagnosis not present

## 2020-03-06 ENCOUNTER — Other Ambulatory Visit: Payer: Self-pay | Admitting: Cardiovascular Disease

## 2020-04-13 ENCOUNTER — Encounter (INDEPENDENT_AMBULATORY_CARE_PROVIDER_SITE_OTHER): Payer: PPO | Admitting: Ophthalmology

## 2020-04-21 DIAGNOSIS — I2111 ST elevation (STEMI) myocardial infarction involving right coronary artery: Secondary | ICD-10-CM | POA: Diagnosis not present

## 2020-04-21 DIAGNOSIS — I10 Essential (primary) hypertension: Secondary | ICD-10-CM | POA: Diagnosis not present

## 2020-04-21 DIAGNOSIS — N400 Enlarged prostate without lower urinary tract symptoms: Secondary | ICD-10-CM | POA: Diagnosis not present

## 2020-04-21 DIAGNOSIS — I2510 Atherosclerotic heart disease of native coronary artery without angina pectoris: Secondary | ICD-10-CM | POA: Diagnosis not present

## 2020-04-21 DIAGNOSIS — E782 Mixed hyperlipidemia: Secondary | ICD-10-CM | POA: Diagnosis not present

## 2020-04-21 DIAGNOSIS — E785 Hyperlipidemia, unspecified: Secondary | ICD-10-CM | POA: Diagnosis not present

## 2020-04-21 DIAGNOSIS — I25110 Atherosclerotic heart disease of native coronary artery with unstable angina pectoris: Secondary | ICD-10-CM | POA: Diagnosis not present

## 2020-04-29 ENCOUNTER — Encounter (INDEPENDENT_AMBULATORY_CARE_PROVIDER_SITE_OTHER): Payer: PPO | Admitting: Ophthalmology

## 2020-04-29 ENCOUNTER — Other Ambulatory Visit: Payer: Self-pay

## 2020-04-29 DIAGNOSIS — H43813 Vitreous degeneration, bilateral: Secondary | ICD-10-CM | POA: Diagnosis not present

## 2020-04-29 DIAGNOSIS — H353112 Nonexudative age-related macular degeneration, right eye, intermediate dry stage: Secondary | ICD-10-CM | POA: Diagnosis not present

## 2020-04-29 DIAGNOSIS — H35373 Puckering of macula, bilateral: Secondary | ICD-10-CM

## 2020-06-02 DIAGNOSIS — I10 Essential (primary) hypertension: Secondary | ICD-10-CM | POA: Diagnosis not present

## 2020-06-02 DIAGNOSIS — N400 Enlarged prostate without lower urinary tract symptoms: Secondary | ICD-10-CM | POA: Diagnosis not present

## 2020-06-02 DIAGNOSIS — I2510 Atherosclerotic heart disease of native coronary artery without angina pectoris: Secondary | ICD-10-CM | POA: Diagnosis not present

## 2020-06-02 DIAGNOSIS — I2111 ST elevation (STEMI) myocardial infarction involving right coronary artery: Secondary | ICD-10-CM | POA: Diagnosis not present

## 2020-06-02 DIAGNOSIS — E785 Hyperlipidemia, unspecified: Secondary | ICD-10-CM | POA: Diagnosis not present

## 2020-06-02 DIAGNOSIS — E782 Mixed hyperlipidemia: Secondary | ICD-10-CM | POA: Diagnosis not present

## 2020-06-02 DIAGNOSIS — I25110 Atherosclerotic heart disease of native coronary artery with unstable angina pectoris: Secondary | ICD-10-CM | POA: Diagnosis not present

## 2020-06-15 DIAGNOSIS — Z1389 Encounter for screening for other disorder: Secondary | ICD-10-CM | POA: Diagnosis not present

## 2020-06-15 DIAGNOSIS — Z0000 Encounter for general adult medical examination without abnormal findings: Secondary | ICD-10-CM | POA: Diagnosis not present

## 2020-06-16 DIAGNOSIS — E782 Mixed hyperlipidemia: Secondary | ICD-10-CM | POA: Diagnosis not present

## 2020-06-16 DIAGNOSIS — Z0000 Encounter for general adult medical examination without abnormal findings: Secondary | ICD-10-CM | POA: Diagnosis not present

## 2020-06-16 DIAGNOSIS — N400 Enlarged prostate without lower urinary tract symptoms: Secondary | ICD-10-CM | POA: Diagnosis not present

## 2020-06-16 DIAGNOSIS — R21 Rash and other nonspecific skin eruption: Secondary | ICD-10-CM | POA: Diagnosis not present

## 2020-06-16 DIAGNOSIS — I10 Essential (primary) hypertension: Secondary | ICD-10-CM | POA: Diagnosis not present

## 2020-06-16 DIAGNOSIS — Z125 Encounter for screening for malignant neoplasm of prostate: Secondary | ICD-10-CM | POA: Diagnosis not present

## 2020-06-16 DIAGNOSIS — Z23 Encounter for immunization: Secondary | ICD-10-CM | POA: Diagnosis not present

## 2020-06-16 DIAGNOSIS — E559 Vitamin D deficiency, unspecified: Secondary | ICD-10-CM | POA: Diagnosis not present

## 2020-06-16 DIAGNOSIS — Z131 Encounter for screening for diabetes mellitus: Secondary | ICD-10-CM | POA: Diagnosis not present

## 2020-08-02 DIAGNOSIS — R000 Tachycardia, unspecified: Secondary | ICD-10-CM

## 2020-08-03 ENCOUNTER — Ambulatory Visit (INDEPENDENT_AMBULATORY_CARE_PROVIDER_SITE_OTHER): Payer: PPO

## 2020-08-03 DIAGNOSIS — R000 Tachycardia, unspecified: Secondary | ICD-10-CM

## 2020-08-03 NOTE — Progress Notes (Unsigned)
Patient enrolled for 14 day ZIO XT to be mailed to [facility], Attn: Taegen Delker, [address], [place], [place]  [address].

## 2020-08-07 DIAGNOSIS — R000 Tachycardia, unspecified: Secondary | ICD-10-CM

## 2020-08-26 DIAGNOSIS — R000 Tachycardia, unspecified: Secondary | ICD-10-CM | POA: Diagnosis not present

## 2020-09-01 DIAGNOSIS — I2510 Atherosclerotic heart disease of native coronary artery without angina pectoris: Secondary | ICD-10-CM | POA: Diagnosis not present

## 2020-09-01 DIAGNOSIS — E785 Hyperlipidemia, unspecified: Secondary | ICD-10-CM | POA: Diagnosis not present

## 2020-09-01 DIAGNOSIS — E782 Mixed hyperlipidemia: Secondary | ICD-10-CM | POA: Diagnosis not present

## 2020-09-01 DIAGNOSIS — I25110 Atherosclerotic heart disease of native coronary artery with unstable angina pectoris: Secondary | ICD-10-CM | POA: Diagnosis not present

## 2020-09-01 DIAGNOSIS — I10 Essential (primary) hypertension: Secondary | ICD-10-CM | POA: Diagnosis not present

## 2020-09-01 DIAGNOSIS — N400 Enlarged prostate without lower urinary tract symptoms: Secondary | ICD-10-CM | POA: Diagnosis not present

## 2020-09-01 DIAGNOSIS — I2111 ST elevation (STEMI) myocardial infarction involving right coronary artery: Secondary | ICD-10-CM | POA: Diagnosis not present

## 2020-09-15 DIAGNOSIS — I25110 Atherosclerotic heart disease of native coronary artery with unstable angina pectoris: Secondary | ICD-10-CM | POA: Diagnosis not present

## 2020-09-15 DIAGNOSIS — I2111 ST elevation (STEMI) myocardial infarction involving right coronary artery: Secondary | ICD-10-CM | POA: Diagnosis not present

## 2020-09-15 DIAGNOSIS — N400 Enlarged prostate without lower urinary tract symptoms: Secondary | ICD-10-CM | POA: Diagnosis not present

## 2020-09-15 DIAGNOSIS — I2510 Atherosclerotic heart disease of native coronary artery without angina pectoris: Secondary | ICD-10-CM | POA: Diagnosis not present

## 2020-09-15 DIAGNOSIS — I10 Essential (primary) hypertension: Secondary | ICD-10-CM | POA: Diagnosis not present

## 2020-09-15 DIAGNOSIS — E782 Mixed hyperlipidemia: Secondary | ICD-10-CM | POA: Diagnosis not present

## 2020-09-15 DIAGNOSIS — E785 Hyperlipidemia, unspecified: Secondary | ICD-10-CM | POA: Diagnosis not present

## 2020-09-18 ENCOUNTER — Telehealth: Payer: Self-pay | Admitting: Pharmacist

## 2020-09-18 NOTE — Telephone Encounter (Signed)
Pt called to request his healthwell grant be renewed. Funds are currently closed. Cannot renew at this time. I have placed him in our list to call when it reopens. I encouraged him to apply for low income subsidy. If he is denied we can submit the denial letter with application to drug company pt assistance to see if he can be approved through them. They require denial for LIS letter

## 2020-11-30 ENCOUNTER — Other Ambulatory Visit: Payer: Self-pay | Admitting: Cardiovascular Disease

## 2020-12-06 ENCOUNTER — Other Ambulatory Visit: Payer: Self-pay | Admitting: Cardiovascular Disease

## 2020-12-08 NOTE — Telephone Encounter (Signed)
Called pt to see if pt is still taking clopidogrel (Plavix) 75 mg tablets, because this medication has expired off of pt's medication list. Pt stated that Dr. Burt Knack, stated that he would not have to be on this medication but for a year and pt states that he has been on this medicaition for 3 years. Would Dr. Burt Knack like to reorder this medication? Please address

## 2020-12-09 DIAGNOSIS — N400 Enlarged prostate without lower urinary tract symptoms: Secondary | ICD-10-CM | POA: Diagnosis not present

## 2020-12-09 DIAGNOSIS — E785 Hyperlipidemia, unspecified: Secondary | ICD-10-CM | POA: Diagnosis not present

## 2020-12-09 DIAGNOSIS — E782 Mixed hyperlipidemia: Secondary | ICD-10-CM | POA: Diagnosis not present

## 2020-12-09 DIAGNOSIS — I2111 ST elevation (STEMI) myocardial infarction involving right coronary artery: Secondary | ICD-10-CM | POA: Diagnosis not present

## 2020-12-09 DIAGNOSIS — I10 Essential (primary) hypertension: Secondary | ICD-10-CM | POA: Diagnosis not present

## 2020-12-09 DIAGNOSIS — I25110 Atherosclerotic heart disease of native coronary artery with unstable angina pectoris: Secondary | ICD-10-CM | POA: Diagnosis not present

## 2020-12-09 DIAGNOSIS — I2510 Atherosclerotic heart disease of native coronary artery without angina pectoris: Secondary | ICD-10-CM | POA: Diagnosis not present

## 2020-12-09 NOTE — Telephone Encounter (Signed)
The patient has completed well over 12 months of DAPT, had a negative myoview scan in 01/2019, and has no ongoing indication for clopidogrel. Please DC this medication. He should be taking ASA 81 mg daily. thanks

## 2020-12-09 NOTE — Telephone Encounter (Signed)
Patient notified. Pharmacy made aware Clopidogrel has been discontinued.

## 2020-12-11 NOTE — Telephone Encounter (Signed)
Did not see any note indicating patient could discontinue Plavix. Will send message to Dr. Burt Knack to see if patient can stop Plavix and if he needs to continue with his metoprolol.

## 2020-12-11 NOTE — Telephone Encounter (Signed)
He can stop plavix. Should continue metoprolol. I wrote a note about it, I think it was a preop clearance note but I can't remember. I can't find the note in the chart either. Thanks and let me know if any questions.

## 2020-12-14 ENCOUNTER — Other Ambulatory Visit: Payer: Self-pay | Admitting: Cardiovascular Disease

## 2020-12-21 NOTE — Telephone Encounter (Signed)
Pt called in and stated that they were informed by snf that they needed proof of pa approval faxed into [employer] snf to see if they qualify to get it free from the manufacturer emailed a copy of this to [place] [facility] to fax in since he stated that his paperwork was at [facility]

## 2020-12-21 NOTE — Telephone Encounter (Signed)
Screenshot faxed to [employer].

## 2021-01-06 ENCOUNTER — Telehealth: Payer: Self-pay | Admitting: Cardiovascular Disease

## 2021-01-06 NOTE — Telephone Encounter (Signed)
Pt c/o medication issue:  1. Name of Medication: REPATHA SURECLICK 015 MG/ML SOAJ  2. How are you currently taking this medication (dosage and times per day)?  Inject every 14 days  3. Are you having a reaction (difficulty breathing--STAT)? no  4. What is your medication issue? Patient states he has not heard anything about the paperwork that was sent in for the medication. He is not sure if they are supposed to contact the office or him.

## 2021-01-07 NOTE — Telephone Encounter (Signed)
Called and spoke w/pt to let them know that we faxed the snf application today and will await a determination.

## 2021-01-07 NOTE — Telephone Encounter (Signed)
Approval letter received. Faxed to [place]

## 2021-01-07 NOTE — Telephone Encounter (Signed)
Called and requested that they send over approval letter

## 2021-01-07 NOTE — Telephone Encounter (Signed)
Called and spoke to [employer] and they stated that the application discovered the [employer] form was signed by dr. Johnsie Cancel who has never seen the pt took me calling a pharmd to figure that out. They stated that they didn't receive a proper approval letter routing to [employer].

## 2021-01-07 NOTE — Telephone Encounter (Signed)
Will you please call pt insurance and see if they can fax over a authorization letter?

## 2021-01-07 NOTE — Telephone Encounter (Signed)
Routing to [employer]

## 2021-01-15 NOTE — Telephone Encounter (Signed)
[place] re-opened. Was able to enroll patient. Will cancel SNF application as healthwell will cover 1 year as opposed to just the end of the year for SNF  CARD NO. [id number]   CARD STATUS [place]   BIN [id number]   PCN [id number]   PC GROUP [id number]

## 2021-03-11 ENCOUNTER — Other Ambulatory Visit: Payer: Self-pay | Admitting: Cardiovascular Disease

## 2021-03-11 DIAGNOSIS — E782 Mixed hyperlipidemia: Secondary | ICD-10-CM | POA: Diagnosis not present

## 2021-03-11 DIAGNOSIS — I10 Essential (primary) hypertension: Secondary | ICD-10-CM | POA: Diagnosis not present

## 2021-03-11 DIAGNOSIS — I2111 ST elevation (STEMI) myocardial infarction involving right coronary artery: Secondary | ICD-10-CM | POA: Diagnosis not present

## 2021-03-11 DIAGNOSIS — E785 Hyperlipidemia, unspecified: Secondary | ICD-10-CM | POA: Diagnosis not present

## 2021-03-11 DIAGNOSIS — I2510 Atherosclerotic heart disease of native coronary artery without angina pectoris: Secondary | ICD-10-CM | POA: Diagnosis not present

## 2021-03-11 DIAGNOSIS — N400 Enlarged prostate without lower urinary tract symptoms: Secondary | ICD-10-CM | POA: Diagnosis not present

## 2021-03-11 DIAGNOSIS — I25110 Atherosclerotic heart disease of native coronary artery with unstable angina pectoris: Secondary | ICD-10-CM | POA: Diagnosis not present

## 2021-03-25 ENCOUNTER — Encounter: Payer: Self-pay | Admitting: Cardiovascular Disease

## 2021-03-25 ENCOUNTER — Ambulatory Visit: Payer: PPO | Admitting: Cardiovascular Disease

## 2021-03-25 ENCOUNTER — Other Ambulatory Visit: Payer: Self-pay

## 2021-03-25 VITALS — BP 124/64 | HR 59 | Ht 71.0 in | Wt 201.4 lb

## 2021-03-25 DIAGNOSIS — E782 Mixed hyperlipidemia: Secondary | ICD-10-CM | POA: Diagnosis not present

## 2021-03-25 DIAGNOSIS — I25119 Atherosclerotic heart disease of native coronary artery with unspecified angina pectoris: Secondary | ICD-10-CM

## 2021-03-25 DIAGNOSIS — I10 Essential (primary) hypertension: Secondary | ICD-10-CM

## 2021-03-25 NOTE — Patient Instructions (Signed)
Medication Instructions:   *If you need a refill on your cardiac medications before your next appointment, please call your pharmacy*   Lab Work: NONE  If you have labs (blood work) drawn today and your tests are completely normal, you will receive your results only by: [place] (if you have MyChart) OR A paper copy in the mail If you have any lab test that is abnormal or we need to change your treatment, we will call you to review the results.   Testing/Procedures: NONE   Follow-Up: At [facility], you and your health needs are our priority.  As part of our continuing mission to provide you with exceptional heart care, we have created designated Provider Care Teams.  These Care Teams include your primary Cardiologist (physician) and Advanced Practice Providers (APPs -  Physician Assistants and Nurse Practitioners) who all work together to provide you with the care you need, when you need it.   Your next appointment:   1 year(s)  The format for your next appointment:   In Person  Provider:   Sherren Mocha, MD

## 2021-03-25 NOTE — Progress Notes (Signed)
Cardiology Office Note:    Date:  03/25/2021   ID:  [name], DOB 10/06/1950, MRN [id number]  PCP:  [name], MD   [facility] Providers Cardiologist:  Sherren Mocha, MD     Referring MD: [name], MD   Chief Complaint  Patient presents with   Coronary Artery Disease    History of Present Illness:    Eric Pace is a 71 y.o. male with a hx of: Coronary artery disease  S/p inf-post STEMI in 09/2018 >> DES to RCA S/p staged PCI 09/2018:  DES to LAD, DES to Dx Myoview 01/2019: no ischemia HFrEF 2/2 Ischemic CM, w/ return of normal EF EF 09/2018: 40-45 ACEi stopped due to low BP 01/2019 Echocardiogram 01/2019: EF 55-60 Hypertension  Hyperlipidemia  Intol of statins Rx w/ Evolocumab, Ezetimibe  The patient is here alone today. He's lost over 20# since his heart attack in 2020. He feels well, follows a healthy diet, and remains active. Today, he denies symptoms of palpitations, chest pain, shortness of breath, orthopnea, PND, lower extremity edema, dizziness, or syncope.   Past Medical History:  Diagnosis Date   CAD (coronary artery disease)    a. cath 09/16/18 aspiration thrombectomy and DES to RCA b. cath 09/18/18  PCI of LAD adn 1st diag with stenting to both and PTCA of the diagonal ostium throught the LAD stent struts   Chronic systolic CHF (congestive heart failure) ([place])    Complication of anesthesia    hard to wake up.    Essential hypertension    Mixed hyperlipidemia    Myocardial infarction ([place])    09/16/18    Past Surgical History:  Procedure Laterality Date   CORONARY STENT INTERVENTION N/A 09/16/2018   Procedure: CORONARY STENT INTERVENTION;  Surgeon: Sherren Mocha, MD;  Location: [facility];  Service: Cardiovascular;  Laterality: N/A;   CORONARY STENT INTERVENTION N/A 09/18/2018   Procedure: CORONARY STENT INTERVENTION;  Surgeon: Sherren Mocha, MD;  Location: [facility];  Service: Cardiovascular;  Laterality: N/A;    CORONARY/GRAFT ACUTE MI REVASCULARIZATION N/A 09/16/2018   Procedure: Coronary/Graft Acute MI Revascularization;  Surgeon: Sherren Mocha, MD;  Location: [facility];  Service: Cardiovascular;  Laterality: N/A;   HEMORRHOIDECTOMY WITH HEMORRHOID BANDING     LEFT HEART CATH AND CORONARY ANGIOGRAPHY N/A 09/16/2018   Procedure: LEFT HEART CATH AND CORONARY ANGIOGRAPHY;  Surgeon: Sherren Mocha, MD;  Location: [facility];  Service: Cardiovascular;  Laterality: N/A;   XI ROBOTIC ASSISTED INGUINAL HERNIA REPAIR WITH MESH Left    10-15 years ago   XI ROBOTIC ASSISTED INGUINAL HERNIA REPAIR WITH MESH Right 11/19/2019   Procedure: ROBOTIC RIGHT INGUINAL HERNIA REPAIR WITH MESH USING COVIDIEN PROGRIP SELF-FIXATING MESH 16cm x 12cm;  Surgeon: Ralene Ok, MD;  Location: [facility];  Service: General;  Laterality: Right;    Current Medications: Current Meds  Medication Sig   aspirin EC 81 MG EC tablet Take 1 tablet (81 mg total) by mouth daily.   calcium carbonate (TUMS - DOSED IN MG ELEMENTAL CALCIUM) 500 MG chewable tablet Chew 500 mg by mouth daily as needed for indigestion or heartburn.    cholecalciferol (VITAMIN D3) 25 MCG (1000 UT) tablet Take 2,000 Units by mouth daily.    ezetimibe (ZETIA) 10 MG tablet Take 1 tablet (10 mg total) by mouth every morning.   metoprolol succinate (TOPROL-XL) 25 MG 24 hr tablet Take 0.5 tablets (12.5 mg total) by mouth daily.  Please keep upcoming appt in January 2023 with Dr. Burt Knack before anymore refills. Thank you Final Attempt   mometasone (ELOCON) 0.1 % cream Apply 1 application topically daily as needed (irritation).    Multiple Vitamins-Minerals (PRESERVISION AREDS 2) CAPS Take 1 capsule by mouth in the morning and at bedtime.    nitroGLYCERIN (NITROSTAT) 0.4 MG SL tablet Place 1 tablet (0.4 mg total) under the tongue every 5 (five) minutes x 3 doses as needed for chest pain.   REPATHA SURECLICK 401 MG/ML SOAJ Inject 1 pen into the skin every 14  days.   tamsulosin (FLOMAX) 0.4 MG CAPS capsule Take 0.4 mg by mouth daily after breakfast.      Allergies:   Statins and Viagra [sildenafil]   Social History   Socioeconomic History   Marital status: Married    Spouse name: Not on file   Number of children: Not on file   Years of education: Not on file   Highest education level: Not on file  Occupational History   Not on file  Tobacco Use   Smoking status: Never   Smokeless tobacco: Never  Vaping Use   Vaping Use: Never used  Substance and Sexual Activity   Alcohol use: Not Currently   Drug use: Never   Sexual activity: Not on file  Other Topics Concern   Not on file  Social History Narrative   Not on file   Social Determinants of Health   Financial Resource Strain: Not on file  Food Insecurity: Not on file  Transportation Needs: Not on file  Physical Activity: Not on file  Stress: Not on file  Social Connections: Not on file     Family History: The patient's family history is not on file. He was adopted.  ROS:   Please see the history of present illness.    All other systems reviewed and are negative.  EKGs/Labs/Other Studies Reviewed:    The following studies were reviewed today: Event Monitor: SUMMARY: The basic rhythm is normal sinus with an average HR of 70 bpm (range 47-140 bpm) There is no atrial fibrillation or flutter There are no bradycardic events or pathologic pauses > 3 seconds There are rare PVC's with a low burden of <1%, rare supraventricular ectopics with few short supraventricular runs (longest 9 beats). No sustained arrhythmia  Myocardial Perfusion Imaging 01/28/2019: Nuclear stress EF: 55%. There was no ST segment deviation noted during stress. The study is normal. This is a low risk study. The left ventricular ejection fraction is normal (55-65%).   1.  This is a normal myocardial perfusion imaging study without evidence of ischemia or prior infarction. 2.  Normal left ventricular  cavity.  Normal left ventricular ejection fraction, 55% 3.  This is a low risk study.  Cardiac Cath (09/18/2018): Successful PCI of the LAD and first diagonal with stenting of both vessels followed by PTCA of the diagonal ostium through the LAD stent struts   Recommendations: Overnight observation, anticipate discharge tomorrow, dual antiplatelet therapy with aspirin and ticagrelor at least 12 months, aggressive risk reduction measures.  EKG:  EKG is ordered today.  The ekg ordered today demonstrates NSR 59 bpm, within normal limits  Recent Labs: No results found for requested labs within last 8760 hours.  Recent Lipid Panel    Component Value Date/Time   CHOL 93 (L) 01/07/2019 0757   TRIG 59 01/07/2019 0757   HDL 43 01/07/2019 0757   CHOLHDL 2.2 01/07/2019 0757   CHOLHDL 5.7 09/17/2018 0227  VLDL 44 (H) 09/17/2018 0227   LDLCALC 36 01/07/2019 0757     Risk Assessment/Calculations:           Physical Exam:    VS:  BP 124/64    Pulse (!) 59    Ht 5\' 11"  (1.803 m)    Wt 201 lb 6.4 oz (91.4 kg)    SpO2 98%    BMI 28.09 kg/m     Wt Readings from Last 3 Encounters:  03/25/21 201 lb 6.4 oz (91.4 kg)  12/10/19 193 lb (87.5 kg)  11/19/19 183 lb (83 kg)     GEN:  Well nourished, well developed in no acute distress HEENT: Normal NECK: No JVD; No carotid bruits LYMPHATICS: No lymphadenopathy CARDIAC: RRR, no murmurs, rubs, gallops RESPIRATORY:  Clear to auscultation without rales, wheezing or rhonchi  ABDOMEN: Soft, non-tender, non-distended MUSCULOSKELETAL:  No edema; No deformity  SKIN: Warm and dry NEUROLOGIC:  Alert and oriented x 3 PSYCHIATRIC:  Normal affect   ASSESSMENT:    1. Coronary artery disease involving native coronary artery of native heart with angina pectoris ([place])   2. Essential hypertension   3. Mixed hyperlipidemia    PLAN:    In order of problems listed above:  Doing very well. I reviewed both of his prior coronary angiograms with him today. He  will continue on ASA and lipid lowering therapies without change. He is active and following a healthy lifestyle.  BP well-controlled on metoprolol succinate.  Lipids at goal on repatha and ezetimibe. LDL 59 mg/dL.       Medication Adjustments/Labs and Tests Ordered: Current medicines are reviewed at length with the patient today.  Concerns regarding medicines are outlined above.  Orders Placed This Encounter  Procedures   EKG 12-Lead   No orders of the defined types were placed in this encounter.   Patient Instructions  Medication Instructions:   *If you need a refill on your cardiac medications before your next appointment, please call your pharmacy*   Lab Work: NONE  If you have labs (blood work) drawn today and your tests are completely normal, you will receive your results only by: [place] (if you have MyChart) OR A paper copy in the mail If you have any lab test that is abnormal or we need to change your treatment, we will call you to review the results.   Testing/Procedures: NONE   Follow-Up: At [facility], you and your health needs are our priority.  As part of our continuing mission to provide you with exceptional heart care, we have created designated Provider Care Teams.  These Care Teams include your primary Cardiologist (physician) and Advanced Practice Providers (APPs -  Physician Assistants and Nurse Practitioners) who all work together to provide you with the care you need, when you need it.   Your next appointment:   1 year(s)  The format for your next appointment:   In Person  Provider:   Sherren Mocha, MD       Signed, Sherren Mocha, MD  03/25/2021 5:02 PM    [place]

## 2021-03-30 ENCOUNTER — Ambulatory Visit: Payer: PPO | Admitting: Cardiovascular Disease

## 2021-04-30 DIAGNOSIS — E782 Mixed hyperlipidemia: Secondary | ICD-10-CM | POA: Diagnosis not present

## 2021-04-30 DIAGNOSIS — I10 Essential (primary) hypertension: Secondary | ICD-10-CM | POA: Diagnosis not present

## 2021-04-30 DIAGNOSIS — I2510 Atherosclerotic heart disease of native coronary artery without angina pectoris: Secondary | ICD-10-CM | POA: Diagnosis not present

## 2021-04-30 DIAGNOSIS — N400 Enlarged prostate without lower urinary tract symptoms: Secondary | ICD-10-CM | POA: Diagnosis not present

## 2021-05-04 ENCOUNTER — Encounter (INDEPENDENT_AMBULATORY_CARE_PROVIDER_SITE_OTHER): Payer: PPO | Admitting: Ophthalmology

## 2021-05-12 DIAGNOSIS — Z789 Other specified health status: Secondary | ICD-10-CM | POA: Diagnosis not present

## 2021-05-12 DIAGNOSIS — D1801 Hemangioma of skin and subcutaneous tissue: Secondary | ICD-10-CM | POA: Diagnosis not present

## 2021-05-12 DIAGNOSIS — L820 Inflamed seborrheic keratosis: Secondary | ICD-10-CM | POA: Diagnosis not present

## 2021-05-12 DIAGNOSIS — R208 Other disturbances of skin sensation: Secondary | ICD-10-CM | POA: Diagnosis not present

## 2021-05-12 DIAGNOSIS — L538 Other specified erythematous conditions: Secondary | ICD-10-CM | POA: Diagnosis not present

## 2021-05-13 ENCOUNTER — Encounter (INDEPENDENT_AMBULATORY_CARE_PROVIDER_SITE_OTHER): Payer: PPO | Admitting: Ophthalmology

## 2021-05-13 ENCOUNTER — Other Ambulatory Visit: Payer: Self-pay

## 2021-05-13 DIAGNOSIS — H43813 Vitreous degeneration, bilateral: Secondary | ICD-10-CM

## 2021-05-13 DIAGNOSIS — H3562 Retinal hemorrhage, left eye: Secondary | ICD-10-CM | POA: Diagnosis not present

## 2021-05-13 DIAGNOSIS — H353122 Nonexudative age-related macular degeneration, left eye, intermediate dry stage: Secondary | ICD-10-CM | POA: Diagnosis not present

## 2021-05-13 DIAGNOSIS — H35373 Puckering of macula, bilateral: Secondary | ICD-10-CM | POA: Diagnosis not present

## 2021-05-13 DIAGNOSIS — H2513 Age-related nuclear cataract, bilateral: Secondary | ICD-10-CM

## 2021-05-23 DIAGNOSIS — I208 Other forms of angina pectoris: Secondary | ICD-10-CM | POA: Diagnosis not present

## 2021-05-23 DIAGNOSIS — R0789 Other chest pain: Secondary | ICD-10-CM | POA: Diagnosis not present

## 2021-05-23 DIAGNOSIS — E785 Hyperlipidemia, unspecified: Secondary | ICD-10-CM | POA: Diagnosis not present

## 2021-05-23 DIAGNOSIS — Z79899 Other long term (current) drug therapy: Secondary | ICD-10-CM | POA: Diagnosis not present

## 2021-05-23 DIAGNOSIS — K8020 Calculus of gallbladder without cholecystitis without obstruction: Secondary | ICD-10-CM | POA: Diagnosis not present

## 2021-05-23 DIAGNOSIS — I10 Essential (primary) hypertension: Secondary | ICD-10-CM | POA: Diagnosis not present

## 2021-05-23 DIAGNOSIS — I252 Old myocardial infarction: Secondary | ICD-10-CM | POA: Diagnosis not present

## 2021-05-23 DIAGNOSIS — K5900 Constipation, unspecified: Secondary | ICD-10-CM | POA: Diagnosis not present

## 2021-05-23 DIAGNOSIS — N400 Enlarged prostate without lower urinary tract symptoms: Secondary | ICD-10-CM | POA: Diagnosis not present

## 2021-05-23 DIAGNOSIS — I2510 Atherosclerotic heart disease of native coronary artery without angina pectoris: Secondary | ICD-10-CM | POA: Diagnosis not present

## 2021-05-23 DIAGNOSIS — R1013 Epigastric pain: Secondary | ICD-10-CM | POA: Diagnosis not present

## 2021-05-23 DIAGNOSIS — Z955 Presence of coronary angioplasty implant and graft: Secondary | ICD-10-CM | POA: Diagnosis not present

## 2021-05-23 DIAGNOSIS — R059 Cough, unspecified: Secondary | ICD-10-CM | POA: Diagnosis not present

## 2021-05-23 DIAGNOSIS — R079 Chest pain, unspecified: Secondary | ICD-10-CM | POA: Diagnosis not present

## 2021-05-23 DIAGNOSIS — K8070 Calculus of gallbladder and bile duct without cholecystitis without obstruction: Secondary | ICD-10-CM | POA: Diagnosis not present

## 2021-05-23 DIAGNOSIS — K8050 Calculus of bile duct without cholangitis or cholecystitis without obstruction: Secondary | ICD-10-CM | POA: Diagnosis not present

## 2021-05-24 DIAGNOSIS — R0789 Other chest pain: Secondary | ICD-10-CM | POA: Diagnosis not present

## 2021-05-25 ENCOUNTER — Encounter: Payer: Self-pay | Admitting: Cardiovascular Disease

## 2021-05-25 DIAGNOSIS — R079 Chest pain, unspecified: Secondary | ICD-10-CM | POA: Diagnosis not present

## 2021-05-25 NOTE — Telephone Encounter (Signed)
Agree with Laurey Arrow. 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 Thanks ? ?

## 2021-05-25 NOTE — Telephone Encounter (Signed)
Called patient about making a DOD appointment. Patient will see Dr. Burt Knack next Monday. ?

## 2021-05-31 ENCOUNTER — Ambulatory Visit: Payer: PPO | Admitting: Cardiovascular Disease

## 2021-05-31 ENCOUNTER — Other Ambulatory Visit: Payer: Self-pay

## 2021-05-31 ENCOUNTER — Encounter: Payer: Self-pay | Admitting: Cardiovascular Disease

## 2021-05-31 VITALS — BP 120/70 | HR 76 | Ht 71.0 in | Wt 200.2 lb

## 2021-05-31 DIAGNOSIS — E782 Mixed hyperlipidemia: Secondary | ICD-10-CM

## 2021-05-31 DIAGNOSIS — I10 Essential (primary) hypertension: Secondary | ICD-10-CM | POA: Diagnosis not present

## 2021-05-31 DIAGNOSIS — I25119 Atherosclerotic heart disease of native coronary artery with unspecified angina pectoris: Secondary | ICD-10-CM | POA: Diagnosis not present

## 2021-05-31 NOTE — Patient Instructions (Signed)
Medication Instructions:  ?Your physician recommends that you continue on your current medications as directed. Please refer to the Current Medication list given to you today. ? ?*If you need a refill on your cardiac medications before your next appointment, please call your pharmacy* ? ? ?Lab Work: ?NONE ?If you have labs (blood work) drawn today and your tests are completely normal, you will receive your results only by: ?MyChart Message (if you have MyChart) OR ?A paper copy in the mail ?If you have any lab test that is abnormal or we need to change your treatment, we will call you to review the results. ? ? ?Testing/Procedures: ?Exercise Myoview Stress test ?Your physician has requested that you have en exercise stress myoview. For further information please visit [email or web address]. Please follow instruction sheet, as given. ? ?Follow-Up: ?At [facility], you and your health needs are our priority.  As part of our continuing mission to provide you with exceptional heart care, we have created designated Provider Care Teams.  These Care Teams include your primary Cardiologist (physician) and Advanced Practice Providers (APPs -  Physician Assistants and Nurse Practitioners) who all work together to provide you with the care you need, when you need it. ? ?Your next appointment:   ?1 year(s) ? ?The format for your next appointment:   ?In Person ? ?Provider:   ?Sherren Mocha, MD   ? ? ?Other Instructions ?See separate instructions for stress test ?  ?

## 2021-05-31 NOTE — Progress Notes (Signed)
?Cardiology Office Note:   ? ?Date:  05/31/2021  ? ?ID:  [name], DOB 28-Feb-1951, MRN [id number] ? ?PCP:  [name], MD ?  ?[facility] ?Cardiologist:  Sherren Mocha, MD    ? ?Referring MD: [name], MD  ? ?Chief Complaint  ?Patient presents with  ? Chest Pain  ? ? ?History of Present Illness:   ? ?Eric Pace is a 71 y.o. male with a hx of: ?Coronary artery disease  ?S/p inf-post STEMI in 09/2018 >> DES to RCA ?S/p staged PCI 09/2018:  DES to LAD, DES to Dx ?Myoview 01/2019: no ischemia ?HFrEF 2/2 Ischemic CM, w/ return of normal EF ?EF 09/2018: 40-45 ?ACEi stopped due to low BP 01/2019 ?Echocardiogram 01/2019: EF 55-60 ?Hypertension  ?Hyperlipidemia  ?Intol of statins ?Rx w/ Evolocumab, Ezetimibe ? ?The patient is here alone today.  He was in [place], [place] when he developed chest pain last week.  He developed substernal pain that felt just like the pain he experienced with his MI.  This later went through to his back and he developed pressure-like discomfort in the middle of the upper back.  He was evaluated in the emergency department with negative findings.  He reportedly had a nonischemic EKG.  I am able to review the notes and labs.  His troponins were negative.  His symptoms ended up being self-limited and resolved on their own.  He was to have a nuclear stress test but this could not be performed in a timely manner.  The patient ultimately was discharged with close outpatient cardiology follow-up here.  I saw him last in January of this year and he was doing well at that time.  He has continued to walk regularly for exercise and do physical activities with no exertional angina.  He has had no recurrence of chest pain since this event last week that lasted several hours.  He was given nitroglycerin and developed some nausea and vomiting with this.  He did not have syncope, orthopnea, PND, or heart palpitations. ? ?Past Medical History:  ?Diagnosis Date  ?  CAD (coronary artery disease)   ? a. cath 09/16/18 aspiration thrombectomy and DES to RCA b. cath 09/18/18  PCI of LAD adn 1st diag with stenting to both and PTCA of the diagonal ostium throught the LAD stent struts  ? Chronic systolic CHF (congestive heart failure) ([place])   ? Complication of anesthesia   ? hard to wake up.   ? Essential hypertension   ? Mixed hyperlipidemia   ? Myocardial infarction [facility])   ? 09/16/18  ? ? ?Past Surgical History:  ?Procedure Laterality Date  ? CORONARY STENT INTERVENTION N/A 09/16/2018  ? Procedure: CORONARY STENT INTERVENTION;  Surgeon: Sherren Mocha, MD;  Location: [facility];  Service: Cardiovascular;  Laterality: N/A;  ? CORONARY STENT INTERVENTION N/A 09/18/2018  ? Procedure: CORONARY STENT INTERVENTION;  Surgeon: Sherren Mocha, MD;  Location: [facility];  Service: Cardiovascular;  Laterality: N/A;  ? CORONARY/GRAFT ACUTE MI REVASCULARIZATION N/A 09/16/2018  ? Procedure: Coronary/Graft Acute MI Revascularization;  Surgeon: Sherren Mocha, MD;  Location: [facility];  Service: Cardiovascular;  Laterality: N/A;  ? HEMORRHOIDECTOMY WITH HEMORRHOID BANDING    ? LEFT HEART CATH AND CORONARY ANGIOGRAPHY N/A 09/16/2018  ? Procedure: LEFT HEART CATH AND CORONARY ANGIOGRAPHY;  Surgeon: Sherren Mocha, MD;  Location: [facility];  Service: Cardiovascular;  Laterality: N/A;  ? XI ROBOTIC ASSISTED INGUINAL HERNIA REPAIR  WITH MESH Left   ? 10-15 years ago  ? XI ROBOTIC ASSISTED INGUINAL HERNIA REPAIR WITH MESH Right 11/19/2019  ? Procedure: ROBOTIC RIGHT INGUINAL HERNIA REPAIR WITH MESH USING COVIDIEN PROGRIP SELF-FIXATING MESH 16cm x 12cm;  Surgeon: Ralene Ok, MD;  Location: [facility];  Service: General;  Laterality: Right;  ? ? ?Current Medications: ?Current Meds  ?Medication Sig  ? aspirin EC 81 MG EC tablet Take 1 tablet (81 mg total) by mouth daily.  ? calcium carbonate (TUMS - DOSED IN MG ELEMENTAL CALCIUM) 500 MG chewable tablet Chew 500 mg by mouth daily as  needed for indigestion or heartburn.   ? cholecalciferol (VITAMIN D3) 25 MCG (1000 UT) tablet Take 2,000 Units by mouth daily.   ? ezetimibe (ZETIA) 10 MG tablet Take 1 tablet (10 mg total) by mouth every morning.  ? metoprolol succinate (TOPROL-XL) 25 MG 24 hr tablet Take 0.5 tablets (12.5 mg total) by mouth daily. Please keep upcoming appt in January 2023 with Dr. Burt Knack before anymore refills. Thank you Final Attempt  ? mometasone (ELOCON) 0.1 % cream Apply 1 application topically daily as needed (irritation).   ? mometasone (ELOCON) 0.1 % cream   ? Multiple Vitamins-Minerals (PRESERVISION AREDS 2) CAPS Take 1 capsule by mouth in the morning and at bedtime.   ? nitroGLYCERIN (NITROSTAT) 0.4 MG SL tablet Place 1 tablet (0.4 mg total) under the tongue every 5 (five) minutes x 3 doses as needed for chest pain.  ? REPATHA SURECLICK 497 MG/ML SOAJ Inject 1 pen into the skin every 14 days.  ? tamsulosin (FLOMAX) 0.4 MG CAPS capsule Take 0.4 mg by mouth daily after breakfast.   ?  ? ?Allergies:   Statins and Viagra [sildenafil]  ? ?Social History  ? ?Socioeconomic History  ? Marital status: Married  ?  Spouse name: Not on file  ? Number of children: Not on file  ? Years of education: Not on file  ? Highest education level: Not on file  ?Occupational History  ? Not on file  ?Tobacco Use  ? Smoking status: Never  ? Smokeless tobacco: Never  ?Vaping Use  ? Vaping Use: Never used  ?Substance and Sexual Activity  ? Alcohol use: Not Currently  ? Drug use: Never  ? Sexual activity: Not on file  ?Other Topics Concern  ? Not on file  ?Social History Narrative  ? Not on file  ? ?Social Determinants of Health  ? ?Financial Resource Strain: Not on file  ?Food Insecurity: Not on file  ?Transportation Needs: Not on file  ?Physical Activity: Not on file  ?Stress: Not on file  ?Social Connections: Not on file  ?  ? ?Family History: ?The patient's family history is not on file. He was adopted. ? ?ROS:   ?Please see the history of  present illness.    ?All other systems reviewed and are negative. ? ?EKGs/Labs/Other Studies Reviewed:   ? ?The following studies were reviewed today: ?Myocardial perfusion imaging 01/28/2019: ?Nuclear stress EF: 55%. ?There was no ST segment deviation noted during stress. ?The study is normal. ?This is a low risk study. ?The left ventricular ejection fraction is normal (55-65%). ?  ?1.  This is a normal myocardial perfusion imaging study without evidence of ischemia or prior infarction. ?2.  Normal left ventricular cavity.  Normal left ventricular ejection fraction, 55% ?3.  This is a low risk study. ? ?Echo 01/28/2019: ? 1. The average left ventricular global longitudinal strain is normal at  ?-18.6 %.  ?  2. Left ventricular ejection fraction, by visual estimation, is 55 to  ?60%. The left ventricle has normal function. There is no left ventricular  ?hypertrophy.  ? 3. Global right ventricle has normal systolic function.The right  ?ventricular size is normal. No increase in right ventricular wall  ?thickness.  ? 4. Left atrial size was mildly dilated.  ? 5. Right atrial size was normal.  ? 6. The mitral valve is normal in structure. Trace mitral valve  ?regurgitation. No evidence of mitral stenosis.  ? 7. The tricuspid valve is normal in structure. Tricuspid valve  ?regurgitation is mild.  ? 8. The aortic valve is tricuspid. There is Moderate thickening of the  ?aortic valve. There is Moderate calcification of the aortic valve. Aortic  ?valve regurgitation is not visualized. Very mild aortic valve stenosis.  ? 9. The pulmonic valve was normal in structure. Pulmonic valve  ?regurgitation is not visualized.  ?10. Normal pulmonary artery systolic pressure.  ?11. The inferior vena cava is normal in size with greater than 50%  ?respiratory variability, suggesting right atrial pressure of 3 mmHg.  ?12. There is Moderate thickening of the aortic valve.  ?13. There is Moderate calcification of the aortic valve.  ? ?EKG:   EKG is ordered today.  The ekg ordered today demonstrates sinus rhythm 76 bpm, nonspecific ST abnormality. ? ?Recent Labs: ?No results found for requested labs within last 8760 hours.  ?Recent Lipid Panel ?   ?

## 2021-06-01 ENCOUNTER — Telehealth (HOSPITAL_COMMUNITY): Payer: Self-pay | Admitting: *Deleted

## 2021-06-01 ENCOUNTER — Encounter (HOSPITAL_COMMUNITY): Payer: PPO

## 2021-06-01 NOTE — Telephone Encounter (Signed)
Patient given detailed instructions per Myocardial Perfusion Study Information Sheet for the test on 06/02/21 Patient notified to arrive 15 minutes early and that it is imperative to arrive on time for appointment to keep from having the test rescheduled. ? If you need to cancel or reschedule your appointment, please call the office within 24 hours of your appointment. . Patient verbalized understanding. Burnie Therien Jacqueline ? ? ?

## 2021-06-02 ENCOUNTER — Other Ambulatory Visit: Payer: Self-pay

## 2021-06-02 ENCOUNTER — Encounter: Payer: Self-pay | Admitting: Cardiovascular Disease

## 2021-06-02 ENCOUNTER — Ambulatory Visit (HOSPITAL_COMMUNITY): Payer: PPO | Attending: Internal Medicine

## 2021-06-02 DIAGNOSIS — I25119 Atherosclerotic heart disease of native coronary artery with unspecified angina pectoris: Secondary | ICD-10-CM | POA: Insufficient documentation

## 2021-06-02 LAB — MYOCARDIAL PERFUSION IMAGING
Angina Index: 0
Duke Treadmill Score: 8
Estimated workload: 9.3
Exercise duration (min): 7 min
Exercise duration (sec): 31 s
LV dias vol: 102 mL (ref 62–150)
LV sys vol: 44 mL
MPHR: 149 {beats}/min
Nuc Stress EF: 56 %
Peak HR: 142 {beats}/min
Percent HR: 95 %
Rest HR: 59 {beats}/min
Rest Nuclear Isotope Dose: 10.3 mCi
SRS: 1
SSS: 0
ST Depression (mm): 0 mm
Stress Nuclear Isotope Dose: 31.3 mCi
TID: 0.97

## 2021-06-02 MED ORDER — TECHNETIUM TC 99M TETROFOSMIN IV KIT
31.3000 | PACK | Freq: Once | INTRAVENOUS | Status: AC | PRN
  Administered 2021-06-02: 31.3 via INTRAVENOUS
  Filled 2021-06-02: qty 32

## 2021-06-02 MED ORDER — TECHNETIUM TC 99M TETROFOSMIN IV KIT
10.3000 | PACK | Freq: Once | INTRAVENOUS | Status: AC | PRN
  Administered 2021-06-02: 10.3 via INTRAVENOUS
  Filled 2021-06-02: qty 11

## 2021-06-04 ENCOUNTER — Other Ambulatory Visit: Payer: Self-pay | Admitting: Cardiovascular Disease

## 2021-06-21 DIAGNOSIS — N400 Enlarged prostate without lower urinary tract symptoms: Secondary | ICD-10-CM | POA: Diagnosis not present

## 2021-06-21 DIAGNOSIS — I10 Essential (primary) hypertension: Secondary | ICD-10-CM | POA: Diagnosis not present

## 2021-06-21 DIAGNOSIS — Z0000 Encounter for general adult medical examination without abnormal findings: Secondary | ICD-10-CM | POA: Diagnosis not present

## 2021-06-21 DIAGNOSIS — R21 Rash and other nonspecific skin eruption: Secondary | ICD-10-CM | POA: Diagnosis not present

## 2021-06-21 DIAGNOSIS — Z125 Encounter for screening for malignant neoplasm of prostate: Secondary | ICD-10-CM | POA: Diagnosis not present

## 2021-06-21 DIAGNOSIS — E782 Mixed hyperlipidemia: Secondary | ICD-10-CM | POA: Diagnosis not present

## 2021-06-21 DIAGNOSIS — E559 Vitamin D deficiency, unspecified: Secondary | ICD-10-CM | POA: Diagnosis not present

## 2021-10-13 DIAGNOSIS — M65331 Trigger finger, right middle finger: Secondary | ICD-10-CM | POA: Diagnosis not present

## 2021-10-13 DIAGNOSIS — M79641 Pain in right hand: Secondary | ICD-10-CM | POA: Diagnosis not present

## 2021-11-07 ENCOUNTER — Other Ambulatory Visit: Payer: Self-pay | Admitting: Cardiovascular Disease

## 2021-11-10 DIAGNOSIS — M79641 Pain in right hand: Secondary | ICD-10-CM | POA: Diagnosis not present

## 2021-11-10 DIAGNOSIS — M65331 Trigger finger, right middle finger: Secondary | ICD-10-CM | POA: Diagnosis not present

## 2022-02-11 ENCOUNTER — Encounter: Payer: Self-pay | Admitting: Pharmacist

## 2022-05-17 ENCOUNTER — Other Ambulatory Visit: Payer: Self-pay | Admitting: Cardiovascular Disease

## 2022-05-18 ENCOUNTER — Encounter (INDEPENDENT_AMBULATORY_CARE_PROVIDER_SITE_OTHER): Payer: PPO | Admitting: Ophthalmology

## 2022-05-31 ENCOUNTER — Encounter (INDEPENDENT_AMBULATORY_CARE_PROVIDER_SITE_OTHER): Payer: PPO | Admitting: Ophthalmology

## 2022-05-31 DIAGNOSIS — H353122 Nonexudative age-related macular degeneration, left eye, intermediate dry stage: Secondary | ICD-10-CM | POA: Diagnosis not present

## 2022-05-31 DIAGNOSIS — H35373 Puckering of macula, bilateral: Secondary | ICD-10-CM | POA: Diagnosis not present

## 2022-05-31 DIAGNOSIS — H353111 Nonexudative age-related macular degeneration, right eye, early dry stage: Secondary | ICD-10-CM

## 2022-05-31 DIAGNOSIS — H43813 Vitreous degeneration, bilateral: Secondary | ICD-10-CM | POA: Diagnosis not present

## 2022-06-01 ENCOUNTER — Ambulatory Visit: Payer: PPO | Attending: Physician Assistant | Admitting: Physician Assistant

## 2022-06-01 ENCOUNTER — Encounter: Payer: Self-pay | Admitting: Physician Assistant

## 2022-06-01 VITALS — BP 118/60 | HR 73 | Ht 71.0 in | Wt 206.8 lb

## 2022-06-01 DIAGNOSIS — I2510 Atherosclerotic heart disease of native coronary artery without angina pectoris: Secondary | ICD-10-CM | POA: Diagnosis not present

## 2022-06-01 DIAGNOSIS — I350 Nonrheumatic aortic (valve) stenosis: Secondary | ICD-10-CM | POA: Insufficient documentation

## 2022-06-01 DIAGNOSIS — E7800 Pure hypercholesterolemia, unspecified: Secondary | ICD-10-CM

## 2022-06-01 MED ORDER — METOPROLOL SUCCINATE ER 25 MG PO TB24: 12.5000 mg | ORAL_TABLET | Freq: Every day | ORAL | 3 refills | Status: DC

## 2022-06-01 NOTE — Assessment & Plan Note (Signed)
Echocardiogram in 2020 with mean gradient 10 mmHg.  He has a notable murmur on exam.  I will obtain a follow-up echocardiogram.

## 2022-06-01 NOTE — Patient Instructions (Signed)
Medication Instructions:   Your physician recommends that you continue on your current medications as directed. Please refer to the Current Medication list given to you today.   *If you need a refill on your cardiac medications before your next appointment, please call your pharmacy*   Lab Work:  None ordered.  If you have labs (blood work) drawn today and your tests are completely normal, you will receive your results only by: [place] (if you have MyChart) OR A paper copy in the mail If you have any lab test that is abnormal or we need to change your treatment, we will call you to review the results.   Testing/Procedures:  Your physician has requested that you have an echocardiogram. Echocardiography is a painless test that uses sound waves to create images of your heart. It provides your doctor with information about the size and shape of your heart and how well your heart's chambers and valves are working. This procedure takes approximately one hour. There are no restrictions for this procedure. Please do NOT wear cologne,aftershave, or lotions (deodorant is allowed). Please arrive 15 minutes prior to your appointment time.    Follow-Up: At [facility], you and your health needs are our priority.  As part of our continuing mission to provide you with exceptional heart care, we have created designated Provider Care Teams.  These Care Teams include your primary Cardiologist (physician) and Advanced Practice Providers (APPs -  Physician Assistants and Nurse Practitioners) who all work together to provide you with the care you need, when you need it.  We recommend signing up for the patient portal called "MyChart".  Sign up information is provided on this After Visit Summary.  MyChart is used to connect with patients for Virtual Visits (Telemedicine).  Patients are able to view lab/test results, encounter notes, upcoming appointments, etc.  Non-urgent messages can be  sent to your provider as well.   To learn more about what you can do with MyChart, go to [email or web address].    Your next appointment:   1 year(s)  Provider:   Sherren Mocha, MD     Other Instructions  Your physician wants you to follow-up in: 1 year with Dr. Burt Knack. You will receive a reminder letter in the mail two months in advance. If you don't receive a letter, please call our office to schedule the follow-up appointment.

## 2022-06-01 NOTE — Progress Notes (Signed)
Cardiology Office Note:    Date:  06/01/2022  ID:  [place], DOB October 15, 1950, MRN [id number] [place] Cardiologist:  Eric Mocha, MD      Patient Profile:   Coronary artery disease  S/p inf-post STEMI in 09/2018 >> s/p 4 x 30 mm DES to pRCA S/p staged PCI 09/2018:  2.75 x 12 mm DES to pLAD, 2.25 x 12 mm DES to D1 Myoview 01/2019: no ischemia Myoview 06/02/2021: EF 56, normal perfusion, low risk HFimpEF (heart failure with improved ejection fraction) 2/2 Ischemic CM EF 09/2018: 40-45 ACEi stopped due to low BP 01/2019 TTE 01/28/19: GLS -18.6%, EF 55-60, normal RVSF, mild LAE, trace MR, mild TR, mild AS (mean 10 mmHg) Hypertension  Hyperlipidemia  Intol of statins; Rx w/ Evolocumab, Ezetimibe Palpitations Monitor 07/2020: NSR, average heart rate 70, no A-fib/flutter, no bradycardia or pauses, rare PVCs (<1%), few SV runs (longest 9 beats)    History of Present Illness:   Eric Pace is a 72 y.o. male who returns for follow-up on CAD.  He was last seen by Dr. Burt Pace 05/31/2021.  He had been seen in the emergency room in [place] prior to this visit with chest pain and a negative workup.  Follow-up stress Myoview was arranged.  This was negative for ischemia, low risk.  He is here alone.  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  He has to lift boxes of food and walk several miles a day when he does this.  He does not have exertional chest discomfort or shortness of breath.  He does note occasional substernal chest discomfort that occurs at rest and is relieved with an acids.  He has not noticed any significant change in the symptoms.  He has not had orthopnea, lower extremity edema or syncope.  Review of Systems  Gastrointestinal:  Negative for dysphagia, hematochezia and melena.  Genitourinary:  Negative for hematuria.   see the HPI    Studies Reviewed:    EKG: NSR, HR 73, normal axis, inferior Q waves, no ST-T wave  changes, QTc 423, similar to prior tracings  Risk Assessment/Calculations:             Physical Exam:   VS:  BP 118/60   Pulse 73   Ht 5\' 11"  (1.803 m)   Wt 206 lb 12.8 oz (93.8 kg)   SpO2 97%   BMI 28.84 kg/m    Wt Readings from Last 3 Encounters:  06/01/22 206 lb 12.8 oz (93.8 kg)  06/02/21 200 lb (90.7 kg)  05/31/21 200 lb 3.2 oz (90.8 kg)    Constitutional:      Appearance: Healthy appearance. Not in distress.  Neck:     Vascular: No carotid bruit. JVD normal.  Pulmonary:     Breath sounds: Normal breath sounds. No wheezing. No rales.  Cardiovascular:     Normal rate. Regular rhythm. Normal S1. Normal S2.      Murmurs: There is a grade 2/6 systolic murmur at the URSB.  Edema:    Peripheral edema absent.  Abdominal:     Palpations: Abdomen is soft.       ASSESSMENT AND PLAN:   Coronary artery disease involving native coronary artery of native heart without angina pectoris History of inferoposterior STEMI in 2020 treated with a DES to the RCA and staged intervention with DES to the LAD and DES to the D1.  Nuclear stress test in March 2023 was low  risk.  He continues to have episodes of substernal chest discomfort occurring at rest and relieved with antiacids.  He has not had exertional symptoms.  His symptoms seem to be most consistent with gastrointestinal etiology.  There has been no significant change.  He knows to contact us for sooner follow-up if he starts to have exertional chest discomfort or symptoms that do not relieve with an acids.  Continue aspirin 81 mg daily, Repatha 140 mg every 14 days, Toprol-XL 12.5 mg daily, as needed nitroglycerin.  Follow-up in 1 year.  Mild aortic stenosis Echocardiogram in 2020 with mean gradient 10 mmHg.  He has a notable murmur on exam.  I will obtain a follow-up echocardiogram.  Hyperlipemia He gets his annual labs with primary care in April.  I personally reviewed his labs from the [place].  His LDL in April 2023 was optimal  at 53.  He gets annual labs with primary care in April.  Continue Repatha 140 mg every 14 days, Zetia 10 mg daily.         Dispo:  Return in about 1 year (around 06/01/2023) for Routine follow up in 1 year with Dr. Burt Pace. Signed, Eric Dopp, PA-C

## 2022-06-01 NOTE — Assessment & Plan Note (Signed)
History of inferoposterior STEMI in 2020 treated with a DES to the RCA and staged intervention with DES to the LAD and DES to the D1.  Nuclear stress test in March 2023 was low risk.  He continues to have episodes of substernal chest discomfort occurring at rest and relieved with antiacids.  He has not had exertional symptoms.  His symptoms seem to be most consistent with gastrointestinal etiology.  There has been no significant change.  He knows to contact us for sooner follow-up if he starts to have exertional chest discomfort or symptoms that do not relieve with an acids.  Continue aspirin 81 mg daily, Repatha 140 mg every 14 days, Toprol-XL 12.5 mg daily, as needed nitroglycerin.  Follow-up in 1 year.

## 2022-06-01 NOTE — Assessment & Plan Note (Signed)
He gets his annual labs with primary care in April.  I personally reviewed his labs from the [facility].  His LDL in April 2023 was optimal at 53.  He gets annual labs with primary care in April.  Continue Repatha 140 mg every 14 days, Zetia 10 mg daily.

## 2022-06-16 ENCOUNTER — Other Ambulatory Visit: Payer: Self-pay | Admitting: Cardiovascular Disease

## 2022-06-23 DIAGNOSIS — E782 Mixed hyperlipidemia: Secondary | ICD-10-CM | POA: Diagnosis not present

## 2022-06-23 DIAGNOSIS — I10 Essential (primary) hypertension: Secondary | ICD-10-CM | POA: Diagnosis not present

## 2022-06-23 DIAGNOSIS — R21 Rash and other nonspecific skin eruption: Secondary | ICD-10-CM | POA: Diagnosis not present

## 2022-06-23 DIAGNOSIS — Z6829 Body mass index (BMI) 29.0-29.9, adult: Secondary | ICD-10-CM | POA: Diagnosis not present

## 2022-06-23 DIAGNOSIS — Z0000 Encounter for general adult medical examination without abnormal findings: Secondary | ICD-10-CM | POA: Diagnosis not present

## 2022-06-23 DIAGNOSIS — N400 Enlarged prostate without lower urinary tract symptoms: Secondary | ICD-10-CM | POA: Diagnosis not present

## 2022-06-23 DIAGNOSIS — Z125 Encounter for screening for malignant neoplasm of prostate: Secondary | ICD-10-CM | POA: Diagnosis not present

## 2022-06-28 ENCOUNTER — Ambulatory Visit (HOSPITAL_COMMUNITY): Payer: PPO | Attending: Physician Assistant

## 2022-06-28 DIAGNOSIS — I2510 Atherosclerotic heart disease of native coronary artery without angina pectoris: Secondary | ICD-10-CM

## 2022-06-28 LAB — ECHOCARDIOGRAM COMPLETE
AR max vel: 1.56 cm2
AV Area VTI: 1.47 cm2
AV Area mean vel: 1.46 cm2
AV Mean grad: 10 mmHg
AV Peak grad: 18.4 mmHg
Ao pk vel: 2.14 m/s
Area-P 1/2: 3.81 cm2
S' Lateral: 3.4 cm

## 2022-08-08 ENCOUNTER — Other Ambulatory Visit: Payer: Self-pay | Admitting: Cardiovascular Disease

## 2022-08-08 DIAGNOSIS — E7800 Pure hypercholesterolemia, unspecified: Secondary | ICD-10-CM

## 2022-08-08 DIAGNOSIS — I2510 Atherosclerotic heart disease of native coronary artery without angina pectoris: Secondary | ICD-10-CM

## 2022-11-16 ENCOUNTER — Other Ambulatory Visit: Payer: Self-pay

## 2022-11-16 DIAGNOSIS — I2510 Atherosclerotic heart disease of native coronary artery without angina pectoris: Secondary | ICD-10-CM

## 2022-11-16 DIAGNOSIS — E7800 Pure hypercholesterolemia, unspecified: Secondary | ICD-10-CM

## 2022-11-16 MED ORDER — NITROGLYCERIN 0.4 MG SL SUBL: 0.4000 mg | SUBLINGUAL_TABLET | SUBLINGUAL | 6 refills | Status: AC | PRN

## 2022-11-16 MED ORDER — REPATHA SURECLICK 140 MG/ML ~~LOC~~ SOAJ
140.0000 mg | SUBCUTANEOUS | 3 refills | Status: DC
Start: 2022-11-16 — End: 2023-12-15

## 2022-11-16 MED ORDER — METOPROLOL SUCCINATE ER 25 MG PO TB24: 12.5000 mg | ORAL_TABLET | Freq: Every day | ORAL | 1 refills | Status: DC

## 2022-11-16 MED ORDER — EZETIMIBE 10 MG PO TABS: 10.0000 mg | ORAL_TABLET | Freq: Every day | ORAL | 1 refills | Status: DC

## 2022-11-16 NOTE — Telephone Encounter (Signed)
Pt is requesting his medication be sent to a different pharmacy [facility]. Please address

## 2023-02-21 ENCOUNTER — Encounter: Payer: Self-pay | Admitting: Pharmacist

## 2023-05-30 ENCOUNTER — Encounter (INDEPENDENT_AMBULATORY_CARE_PROVIDER_SITE_OTHER): Payer: PPO | Admitting: Ophthalmology

## 2023-06-07 ENCOUNTER — Encounter (INDEPENDENT_AMBULATORY_CARE_PROVIDER_SITE_OTHER): Admitting: Ophthalmology

## 2023-07-03 DIAGNOSIS — L309 Dermatitis, unspecified: Secondary | ICD-10-CM | POA: Diagnosis not present

## 2023-07-03 DIAGNOSIS — I10 Essential (primary) hypertension: Secondary | ICD-10-CM | POA: Diagnosis not present

## 2023-07-03 DIAGNOSIS — Z23 Encounter for immunization: Secondary | ICD-10-CM | POA: Diagnosis not present

## 2023-07-03 DIAGNOSIS — Z0000 Encounter for general adult medical examination without abnormal findings: Secondary | ICD-10-CM | POA: Diagnosis not present

## 2023-07-03 DIAGNOSIS — E559 Vitamin D deficiency, unspecified: Secondary | ICD-10-CM | POA: Diagnosis not present

## 2023-07-03 DIAGNOSIS — G720 Drug-induced myopathy: Secondary | ICD-10-CM | POA: Diagnosis not present

## 2023-07-03 DIAGNOSIS — R972 Elevated prostate specific antigen [PSA]: Secondary | ICD-10-CM | POA: Diagnosis not present

## 2023-07-03 DIAGNOSIS — Z6829 Body mass index (BMI) 29.0-29.9, adult: Secondary | ICD-10-CM | POA: Diagnosis not present

## 2023-07-03 DIAGNOSIS — Z125 Encounter for screening for malignant neoplasm of prostate: Secondary | ICD-10-CM | POA: Diagnosis not present

## 2023-07-03 DIAGNOSIS — E782 Mixed hyperlipidemia: Secondary | ICD-10-CM | POA: Diagnosis not present

## 2023-07-03 DIAGNOSIS — N400 Enlarged prostate without lower urinary tract symptoms: Secondary | ICD-10-CM | POA: Diagnosis not present

## 2023-07-05 ENCOUNTER — Encounter (INDEPENDENT_AMBULATORY_CARE_PROVIDER_SITE_OTHER): Admitting: Ophthalmology

## 2023-07-05 DIAGNOSIS — H353122 Nonexudative age-related macular degeneration, left eye, intermediate dry stage: Secondary | ICD-10-CM | POA: Diagnosis not present

## 2023-07-05 DIAGNOSIS — H2513 Age-related nuclear cataract, bilateral: Secondary | ICD-10-CM | POA: Diagnosis not present

## 2023-07-05 DIAGNOSIS — H43813 Vitreous degeneration, bilateral: Secondary | ICD-10-CM | POA: Diagnosis not present

## 2023-07-05 DIAGNOSIS — H353111 Nonexudative age-related macular degeneration, right eye, early dry stage: Secondary | ICD-10-CM | POA: Diagnosis not present

## 2023-07-05 DIAGNOSIS — H35373 Puckering of macula, bilateral: Secondary | ICD-10-CM | POA: Diagnosis not present

## 2023-07-06 ENCOUNTER — Ambulatory Visit: Payer: PPO | Attending: Cardiovascular Disease | Admitting: Cardiovascular Disease

## 2023-07-06 ENCOUNTER — Encounter: Payer: Self-pay | Admitting: Cardiovascular Disease

## 2023-07-06 VITALS — BP 110/66 | HR 80 | Ht 70.0 in | Wt 204.4 lb

## 2023-07-06 DIAGNOSIS — R3914 Feeling of incomplete bladder emptying: Secondary | ICD-10-CM | POA: Diagnosis not present

## 2023-07-06 DIAGNOSIS — I2510 Atherosclerotic heart disease of native coronary artery without angina pectoris: Secondary | ICD-10-CM

## 2023-07-06 DIAGNOSIS — Z125 Encounter for screening for malignant neoplasm of prostate: Secondary | ICD-10-CM | POA: Diagnosis not present

## 2023-07-06 DIAGNOSIS — I350 Nonrheumatic aortic (valve) stenosis: Secondary | ICD-10-CM | POA: Diagnosis not present

## 2023-07-06 DIAGNOSIS — E7800 Pure hypercholesterolemia, unspecified: Secondary | ICD-10-CM

## 2023-07-06 NOTE — Progress Notes (Signed)
 Cardiology Office Note:    Date:  07/06/2023   ID:  Eric Pace, DOB [birth year], MRN [id number]  PCP:  Jimmey Mould, MD   [facility] Providers Cardiologist:  Arnoldo Lapping, MD     Referring MD: Jimmey Mould, MD   Chief Complaint  Patient presents with   Coronary Artery Disease    History of Present Illness:    Eric Pace is a 73 y.o. male with a hx of:  Coronary artery disease  S/p inf-post STEMI in 09/2018 >> s/p 4 x 30 mm DES to pRCA S/p staged PCI 09/2018:  2.75 x 12 mm DES to pLAD, 2.25 x 12 mm DES to D1 Myoview  01/2019: no ischemia Myoview  06/02/2021: EF 56, normal perfusion, low risk HFimpEF (heart failure with improved ejection fraction) 2/2 Ischemic CM EF 09/2018: 40-45 ACEi stopped due to low BP 01/2019 TTE 01/28/19: GLS -18.6%, EF 55-60, normal RVSF, mild LAE, trace MR, mild TR, mild AS (mean 10 mmHg) Hypertension  Hyperlipidemia  Intol of statins; Rx w/ Evolocumab , Ezetimibe  Palpitations Monitor 07/2020: NSR, average heart rate 70, no A-fib/flutter, no bradycardia or pauses, rare PVCs (<1%), few SV runs (longest 9 beats)  He is here alone today. He's active with no exertional symptoms. Today, he denies symptoms of palpitations, chest pain, shortness of breath, orthopnea, PND, lower extremity edema, dizziness, or syncope. He recently had an increase in his PSA and he is going to see urology today in consultation.  He is doing some traveling with his wife and they plan to go to [place]  and possibly to the [facility] in their camper.  Current Medications: Current Meds  Medication Sig   aspirin  EC 81 MG EC tablet Take 1 tablet (81 mg total) by mouth daily.   calcium carbonate (TUMS - DOSED IN MG ELEMENTAL CALCIUM) 500 MG chewable tablet Chew 500 mg by mouth daily as needed for indigestion or heartburn.    cholecalciferol (VITAMIN D3) 25 MCG (1000 UT) tablet Take 2,000 Units by mouth daily.    Evolocumab  (REPATHA  SURECLICK) 140  MG/ML SOAJ Inject 140 mg into the skin every 14 (fourteen) days.   ezetimibe  (ZETIA ) 10 MG tablet Take 1 tablet (10 mg total) by mouth daily.   metoprolol  succinate (TOPROL -XL) 25 MG 24 hr tablet Take 0.5 tablets (12.5 mg total) by mouth daily.   mometasone (ELOCON) 0.1 % cream Apply 1 application topically daily as needed (irritation).    Multiple Vitamins-Minerals (PRESERVISION AREDS 2) CAPS Take 1 capsule by mouth in the morning and at bedtime.    nitroGLYCERIN  (NITROSTAT ) 0.4 MG SL tablet Place 1 tablet (0.4 mg total) under the tongue every 5 (five) minutes x 3 doses as needed for chest pain.   tamsulosin (FLOMAX) 0.4 MG CAPS capsule Take 0.4 mg by mouth daily after breakfast. Can take another 0.4 capsule if needed     Allergies:   Statins and Viagra [sildenafil]   ROS:   Please see the history of present illness.    All other systems reviewed and are negative.  EKGs/Labs/Other Studies Reviewed:    The following studies were reviewed today: Cardiac Studies & Procedures   ______________________________________________________________________________________________ CARDIAC CATHETERIZATION  CARDIAC CATHETERIZATION 09/18/2018  Conclusion Successful PCI of the LAD and first diagonal with stenting of both vessels followed by PTCA of the diagonal ostium through the LAD stent struts  Recommendations: Overnight observation, anticipate discharge tomorrow, dual antiplatelet therapy with aspirin  and ticagrelor  at least 12 months, aggressive risk reduction  measures.  Findings Coronary Findings Diagnostic  Dominance: Right  Left Anterior Descending Prox LAD to Mid LAD lesion is 90% stenosed.  First Diagonal Branch 1st Diag lesion is 90% stenosed.  Left Circumflex Prox Cx to Mid Cx lesion is 40% stenosed.  Right Coronary Artery Previously placed Prox RCA drug eluting stent is widely patent. The lesion is ulcerative and heavily thrombotic. TIMI-2 flow  Intervention  Prox LAD to Mid  LAD lesion Stent CATHETER LAUNCHER 6FREBU 3.5 guide catheter was inserted. Lesion crossed with guidewire using a WIRE COUGAR XT STRL 190CM. Pre-stent angioplasty was performed using a BALLOON SAPPHIRE [place] 2.5X8. A drug-eluting stent was successfully placed using a STENT RESOLUTE ONYX [id number]. Post-stent angioplasty was performed using a BALLOON SAPPHIRE [place] 3.0X8. Cougar wires are placed in the first diagonal in the LAD without difficulty.  The diagonal is treated first as the lesion was located in the midportion of the vessel beyond the LAD/diagonal bifurcation.  The LAD is then treated with balloon angioplasty, stenting across the diagonal, and post dilatation.  Prior to postdilatation, the jailed diagonal wire is pulled back and then used to recross the LAD through the stent struts back into the diagonal.  The diagonal ostium is dilated with a 2.5 x 12 mm balloon to nominal pressure.  The LAD is then postdilated with a 3.0 mm noncompliant balloon to 14 atm.  At the completion of the procedure there is TIMI-3 flow in both vessels with 0% residual stenosis at the LAD and diagonal stent sites and 70% residual stenosis at the diagonal ostium with no evidence of disruption of the vessel or slow flow. Post-Intervention Lesion Assessment The intervention was successful. Pre-interventional TIMI flow is 3. Post-intervention TIMI flow is 3. No complications occurred at this lesion. There is a 0% residual stenosis post intervention.  1st Diag lesion Stent CATHETER LAUNCHER 6FREBU 3.5 guide catheter was inserted. Lesion crossed with guidewire using a WIRE COUGAR XT STRL 190CM. Pre-stent angioplasty was not performed. A drug-eluting stent was successfully placed using a STENT RESOLUTE ONYX 2.25X12. Post-stent angioplasty was performed using a BALLOON SAPPHIRE 2.5X12. Post-Intervention Lesion Assessment The intervention was successful. Pre-interventional TIMI flow is 3. Post-intervention TIMI flow is 3. No  complications occurred at this lesion. There is a 0% residual stenosis post intervention.   CARDIAC CATHETERIZATION 09/16/2018  Conclusion 1.  Acute inferoposterior MI secondary to subtotal occlusion of the RCA, treated successfully with aspiration thrombectomy, PTCA, and drug-eluting stent implantation 2.  Severe residual stenosis involving the LAD/first diagonal 3.  Patent left main and left circumflex without high-grade obstruction 4.  Mild to moderate segmental LV systolic dysfunction with akinesis of the inferior wall and estimated LVEF of 45%  Recommendations: Staged PCI of the LAD/diagonal during the patient's index hospitalization.  CCU care and close monitoring of the patient's groin hematoma.  Type and screen is done.  Patient is hemodynamically stable.  Findings Coronary Findings Diagnostic  Dominance: Right  Left Anterior Descending Prox LAD to Mid LAD lesion is 90% stenosed.  First Diagonal Branch 1st Diag lesion is 90% stenosed.  Left Circumflex Prox Cx to Mid Cx lesion is 40% stenosed.  Right Coronary Artery Prox RCA lesion is 99% stenosed. The lesion is ulcerative and heavily thrombotic. TIMI-2 flow  Intervention  Prox RCA lesion Stent CATHETER LAUNCHER 6FR JR4 guide catheter was inserted. Lesion crossed with guidewire using a WIRE COUGAR XT STRL 190CM. Pre-stent angioplasty was performed using a BALLOON SAPPHIRE 2.5X15. A drug-eluting stent was successfully placed using  a STENT RESOLUTE ONYX 4.0X30. Post-stent angioplasty was performed using a BALLOON SAPPHIRE [place] 4.0X18. Maximum pressure:  16 atm. The lesion is ulcerated with heavy associated thrombus and TIMI II flow.  Aspiration thrombectomy is initially performed.  This is followed by balloon angioplasty and drug-eluting stent implantation, then finally with stent postdilatation.  The patient tolerated the procedure well with TIMI-3 flow and 0% residual stenosis at the completion. Post-Intervention Lesion  Assessment The intervention was successful. Pre-interventional TIMI flow is 2. Post-intervention TIMI flow is 3. No complications occurred at this lesion. There is a 0% residual stenosis post intervention.   STRESS TESTS  MYOCARDIAL PERFUSION IMAGING 06/02/2021  Narrative   The study is normal. The study is low risk.   No ST deviation was noted.   Left ventricular function is normal. Nuclear stress EF: 56 %. The left ventricular ejection fraction is normal (55-65%). End diastolic cavity size is normal.   Prior study available for comparison from 01/28/2019.   ECHOCARDIOGRAM  ECHOCARDIOGRAM COMPLETE 06/28/2022  Narrative ECHOCARDIOGRAM REPORT    Patient Name:   Eric Pace Date of Exam: 06/28/2022 Medical Rec #:  [id number]       Height:       71.0 in Accession #:    [id number]      Weight:       206.8 lb Date of Birth:  Jul 16, 1950        BSA:          2.139 m Patient Age:    71 years        BP:           118/60 mmHg Patient Gender: M               HR:           58 bpm. Exam Location:  [address]  Procedure: 2D Echo, Cardiac Doppler, Limited Color Doppler and Strain Analysis  Indications:    I25.10 CAD  History:        Patient has prior history of Echocardiogram examinations, most recent 01/28/2019. CAD and Previous Myocardial Infarction, Mild AS; Risk Factors:Hypertension and Dyslipidemia.  Sonographer:    Lula Sale RDCS Referring Phys: [id number] Awilda Bogus WEAVER  IMPRESSIONS   1. Left ventricular ejection fraction, by estimation, is 50 to 55%. The left ventricle has low normal function. The left ventricle has no regional wall motion abnormalities. There is mild left ventricular hypertrophy. Left ventricular diastolic parameters were normal. 2. Right ventricular systolic function is normal. The right ventricular size is normal. There is normal pulmonary artery systolic pressure. The estimated right ventricular systolic pressure is 29.6 mmHg. 3. Left atrial size was  mildly dilated. 4. The mitral valve is grossly normal. Trivial mitral valve regurgitation. No evidence of mitral stenosis. 5. The aortic valve is tricuspid. There is moderate calcification of the aortic valve. There is moderate thickening of the aortic valve. Aortic valve regurgitation is not visualized. Mild aortic valve stenosis. Aortic valve area, by VTI measures 1.47 cm. Aortic valve mean gradient measures 10.0 mmHg. Aortic valve Vmax measures 2.14 m/s. 6. The inferior vena cava is normal in size with greater than 50% respiratory variability, suggesting right atrial pressure of 3 mmHg.  FINDINGS Left Ventricle: Left ventricular ejection fraction, by estimation, is 50 to 55%. The left ventricle has low normal function. The left ventricle has no regional wall motion abnormalities. Global longitudinal strain performed but not reported based on interpreter judgement due to suboptimal tracking. The left ventricular  internal cavity size was normal in size. There is mild left ventricular hypertrophy. Left ventricular diastolic parameters were normal.  Right Ventricle: The right ventricular size is normal. No increase in right ventricular wall thickness. Right ventricular systolic function is normal. There is normal pulmonary artery systolic pressure. The tricuspid regurgitant velocity is 2.58 m/s, and with an assumed right atrial pressure of 3 mmHg, the estimated right ventricular systolic pressure is 29.6 mmHg.  Left Atrium: Left atrial size was mildly dilated.  Right Atrium: Right atrial size was normal in size.  Pericardium: There is no evidence of pericardial effusion.  Mitral Valve: The mitral valve is grossly normal. Trivial mitral valve regurgitation. No evidence of mitral valve stenosis.  Tricuspid Valve: The tricuspid valve is normal in structure. Tricuspid valve regurgitation is mild . No evidence of tricuspid stenosis.  Aortic Valve: The aortic valve is tricuspid. There is moderate  calcification of the aortic valve. There is moderate thickening of the aortic valve. Aortic valve regurgitation is not visualized. Mild aortic stenosis is present. Aortic valve mean gradient measures 10.0 mmHg. Aortic valve peak gradient measures 18.4 mmHg. Aortic valve area, by VTI measures 1.47 cm.  Pulmonic Valve: The pulmonic valve was grossly normal. Pulmonic valve regurgitation is trivial. No evidence of pulmonic stenosis.  Aorta: The aortic root is normal in size and structure.  Venous: The inferior vena cava is normal in size with greater than 50% respiratory variability, suggesting right atrial pressure of 3 mmHg.  IAS/Shunts: The atrial septum is grossly normal.   LEFT VENTRICLE PLAX 2D LVIDd:         5.00 cm   Diastology LVIDs:         3.40 cm   LV e' medial:    8.49 cm/s LV PW:         1.00 cm   LV E/e' medial:  9.2 LV IVS:        1.10 cm   LV e' lateral:   11.10 cm/s LVOT diam:     2.20 cm   LV E/e' lateral: 7.0 LV SV:         78 LV SV Index:   37        2D Longitudinal Strain LVOT Area:     3.80 cm  2D Strain GLS (A2C):   18.3 % 2D Strain GLS (A3C):   13.3 % 2D Strain GLS (A4C):   15.0 %  RIGHT VENTRICLE             IVC RV S prime:     10.30 cm/s  IVC diam: 1.80 cm TAPSE (M-mode): 1.9 cm RVSP:           29.6 mmHg  LEFT ATRIUM             Index        RIGHT ATRIUM           Index LA diam:        4.80 cm 2.24 cm/m   RA Pressure: 3.00 mmHg LA Vol (A2C):   81.4 ml 38.06 ml/m  RA Area:     14.70 cm LA Vol (A4C):   77.9 ml 36.42 ml/m  RA Volume:   36.90 ml  17.25 ml/m LA Biplane Vol: 86.2 ml 40.30 ml/m AORTIC VALVE AV Area (Vmax):    1.56 cm AV Area (Vmean):   1.46 cm AV Area (VTI):     1.47 cm AV Vmax:           214.28  cm/s AV Vmean:          151.551 cm/s AV VTI:            0.534 m AV Peak Grad:      18.4 mmHg AV Mean Grad:      10.0 mmHg LVOT Vmax:         87.80 cm/s LVOT Vmean:        58.300 cm/s LVOT VTI:          0.206 m LVOT/AV VTI ratio:  0.39  AORTA Ao Root diam: 3.60 cm Ao Asc diam:  3.30 cm  MITRAL VALVE               TRICUSPID VALVE MV Area (PHT): 3.81 cm    TR Peak grad:   26.6 mmHg MV Decel Time: 199 msec    TR Vmax:        258.00 cm/s MV E velocity: 78.10 cm/s  Estimated RAP:  3.00 mmHg MV A velocity: 69.70 cm/s  RVSP:           29.6 mmHg MV E/A ratio:  1.12 SHUNTS Systemic VTI:  0.21 m Systemic Diam: 2.20 cm  Grady Lawman MD Electronically signed by Grady Lawman MD Signature Date/Time: 06/28/2022/9:04:31 AM    Final    MONITORS  LONG TERM MONITOR (3-14 DAYS) 08/26/2020  Narrative Patch Wear Time:  14 days and 0 hours (2022-05-27T07:22:24-0400 to 2022-06-10T07:22:28-0400)  Patient had a min HR of 47 bpm, max HR of 140 bpm, and avg HR of 70 bpm. Predominant underlying rhythm was Sinus Rhythm. 3 Supraventricular Tachycardia runs occurred, the run with the fastest interval lasting 4 beats with a max rate of 109 bpm, the longest lasting 9 beats with an avg rate of 92 bpm. Isolated SVEs were rare (<1.0%), SVE Couplets were rare (<1.0%), and SVE Triplets were rare (<1.0%). Isolated VEs were rare (<1.0%), and no VE Couplets or VE Triplets were present. Ventricular Bigeminy and Trigeminy were present.  SUMMARY: The basic rhythm is normal sinus with an average HR of 70 bpm (range 47-140 bpm) There is no atrial fibrillation or flutter There are no bradycardic events or pathologic pauses > 3 seconds There are rare PVC's with a low burden of <1%, rare supraventricular ectopics with few short supraventricular runs (longest 9 beats). No sustained arrhythmia       ______________________________________________________________________________________________      EKG:   EKG Interpretation Date/Time:  Thursday July 06 2023 08:11:58 EDT Ventricular Rate:  80 PR Interval:  154 QRS Duration:  96 QT Interval:  366 QTC Calculation: 422 R Axis:   38  Text Interpretation: Sinus rhythm with sinus  arrhythmia with occasional Premature ventricular complexes When compared with ECG of 02-Jun-2021 09:32, Premature ventricular complexes are now Present Vent. rate has decreased BY  53 BPM Minimal criteria for Inferior infarct are no longer Present ST no longer depressed in Lateral leads Confirmed by Arnoldo Lapping [phone]) on 07/06/2023 8:33:14 AM    Recent Labs: No results found for requested labs within last 365 days.  Recent Lipid Panel    Component Value Date/Time   CHOL 93 (L) 01/07/2019 0757   TRIG 59 01/07/2019 0757   HDL 43 01/07/2019 0757   CHOLHDL 2.2 01/07/2019 0757   CHOLHDL 5.7 09/17/2018 0227   VLDL 44 (H) 09/17/2018 0227   LDLCALC 36 01/07/2019 0757     Risk Assessment/Calculations:                Physical Exam:  VS:  BP 110/66   Pulse 80   Ht 5\' 10"  (1.778 m)   Wt 204 lb 6.4 oz (92.7 kg)   SpO2 98%   BMI 29.33 kg/m     Wt Readings from Last 3 Encounters:  07/06/23 204 lb 6.4 oz (92.7 kg)  06/01/22 206 lb 12.8 oz (93.8 kg)  06/02/21 200 lb (90.7 kg)     GEN: Well nourished, well developed in no acute distress HEENT: Normal NECK: No JVD; No carotid bruits LYMPHATICS: No lymphadenopathy CARDIAC: RRR, 3/6 early peaking crescendo decrescendo murmur at the right upper sternal border RESPIRATORY:  Clear to auscultation without rales, wheezing or rhonchi  ABDOMEN: Soft, non-tender, non-distended MUSCULOSKELETAL:  No edema; No deformity  SKIN: Warm and dry NEUROLOGIC:  Alert and oriented x 3 PSYCHIATRIC:  Normal affect   Assessment & Plan Coronary artery disease involving native coronary artery of native heart without angina pectoris The patient is doing very well with a good workload and no symptoms.  I reviewed his last stress test in 2023 which showed normal LVEF and no ischemia.  He was completely revascularized with PCI at the time of his MI followed by a staged PCI during his index hospitalization.  He remains on aspirin  for antiplatelet therapy, a  PCSK9 inhibitor in the setting of statin intolerance, and low-dose beta-blocker.  No changes are made today.  I will see him back in 1 year for follow-up evaluation. Mild aortic stenosis Last echo study reviewed and remain consistent with mild aortic stenosis.  Patient is asymptomatic.  I like him to have an echocardiogram for surveillance next year just prior to his office visit.  We discussed the natural history of aortic stenosis today and reviewed the typical slow progression that occurs.  We discussed potential symptoms of aortic stenosis as well. Pure hypercholesterolemia The patient is treated with Repatha  and ezetimibe .  Continue current management.  Lipids have been at goal with LDL less than 70 mg/dL.       Medication Adjustments/Labs and Tests Ordered: Current medicines are reviewed at length with the patient today.  Concerns regarding medicines are outlined above.  Orders Placed This Encounter  Procedures   EKG 12-Lead   ECHOCARDIOGRAM COMPLETE   No orders of the defined types were placed in this encounter.   Patient Instructions  Testing: ECHO (in 1 year) Your physician has requested that you have an echocardiogram. Echocardiography is a painless test that uses sound waves to create images of your heart. It provides your doctor with information about the size and shape of your heart and how well your heart's chambers and valves are working. This procedure takes approximately one hour. There are no restrictions for this procedure. Please do NOT wear cologne, perfume, aftershave, or lotions (deodorant is allowed). Please arrive 15 minutes prior to your appointment time.  Please note: We ask at that you not bring children with you during ultrasound (echo/ vascular) testing. Due to room size and safety concerns, children are not allowed in the ultrasound rooms during exams. Our front office staff cannot provide observation of children in our lobby area while testing is being  conducted. An adult accompanying a patient to their appointment will only be allowed in the ultrasound room at the discretion of the ultrasound technician under special circumstances. We apologize for any inconvenience.  Follow-Up: At [facility], you and your health needs are our priority.  As part of our continuing mission to provide you with exceptional heart care, our  providers are all part of one team.  This team includes your primary Cardiologist (physician) and Advanced Practice Providers or APPs (Physician Assistants and Nurse Practitioners) who all work together to provide you with the care you need, when you need it.  Your next appointment:   1 year(s)  Provider:   Arnoldo Lapping, MD        1st Floor: - Lobby - Registration  - Pharmacy  - Lab - Cafe  2nd Floor: - PV Lab - Diagnostic Testing (echo, CT, nuclear med)  3rd Floor: - Vacant  4th Floor: - TCTS (cardiothoracic surgery) - AFib Clinic - Structural Heart Clinic - Vascular Surgery  - Vascular Ultrasound  5th Floor: - HeartCare Cardiology (general and EP) - Clinical Pharmacy for coumadin, hypertension, lipid, weight-loss medications, and med management appointments    Valet parking services will be available as well.     Signed, Arnoldo Lapping, MD  07/06/2023 12:31 PM    [facility]

## 2023-07-06 NOTE — Assessment & Plan Note (Signed)
 The patient is doing very well with a good workload and no symptoms.  I reviewed his last stress test in 2023 which showed normal LVEF and no ischemia.  He was completely revascularized with PCI at the time of his MI followed by a staged PCI during his index hospitalization.  He remains on aspirin  for antiplatelet therapy, a PCSK9 inhibitor in the setting of statin intolerance, and low-dose beta-blocker.  No changes are made today.  I will see him back in 1 year for follow-up evaluation.

## 2023-07-06 NOTE — Patient Instructions (Addendum)
 Testing: ECHO (in 1 year) Your physician has requested that you have an echocardiogram. Echocardiography is a painless test that uses sound waves to create images of your heart. It provides your doctor with information about the size and shape of your heart and how well your heart's chambers and valves are working. This procedure takes approximately one hour. There are no restrictions for this procedure. Please do NOT wear cologne, perfume, aftershave, or lotions (deodorant is allowed). Please arrive 15 minutes prior to your appointment time.  Please note: We ask at that you not bring children with you during ultrasound (echo/ vascular) testing. Due to room size and safety concerns, children are not allowed in the ultrasound rooms during exams. Our front office staff cannot provide observation of children in our lobby area while testing is being conducted. An adult accompanying a patient to their appointment will only be allowed in the ultrasound room at the discretion of the ultrasound technician under special circumstances. We apologize for any inconvenience.  Follow-Up: At [facility], you and your health needs are our priority.  As part of our continuing mission to provide you with exceptional heart care, our providers are all part of one team.  This team includes your primary Cardiologist (physician) and Advanced Practice Providers or APPs (Physician Assistants and Nurse Practitioners) who all work together to provide you with the care you need, when you need it.  Your next appointment:   1 year(s)  Provider:   Arnoldo Lapping, MD        1st Floor: - Lobby - Registration  - Pharmacy  - Lab - Cafe  2nd Floor: - PV Lab - Diagnostic Testing (echo, CT, nuclear med)  3rd Floor: - Vacant  4th Floor: - TCTS (cardiothoracic surgery) - AFib Clinic - Structural Heart Clinic - Vascular Surgery  - Vascular Ultrasound  5th Floor: - HeartCare Cardiology (general and EP) -  Clinical Pharmacy for coumadin, hypertension, lipid, weight-loss medications, and med management appointments    Valet parking services will be available as well.

## 2023-07-06 NOTE — Assessment & Plan Note (Signed)
 The patient is treated with Repatha  and ezetimibe .  Continue current management.  Lipids have been at goal with LDL less than 70 mg/dL.

## 2023-07-06 NOTE — Assessment & Plan Note (Signed)
 Last echo study reviewed and remain consistent with mild aortic stenosis.  Patient is asymptomatic.  I like him to have an echocardiogram for surveillance next year just prior to his office visit.  We discussed the natural history of aortic stenosis today and reviewed the typical slow progression that occurs.  We discussed potential symptoms of aortic stenosis as well.

## 2023-08-29 DIAGNOSIS — H40033 Anatomical narrow angle, bilateral: Secondary | ICD-10-CM | POA: Diagnosis not present

## 2023-08-29 DIAGNOSIS — H1013 Acute atopic conjunctivitis, bilateral: Secondary | ICD-10-CM | POA: Diagnosis not present

## 2023-10-10 DIAGNOSIS — K5730 Diverticulosis of large intestine without perforation or abscess without bleeding: Secondary | ICD-10-CM | POA: Diagnosis not present

## 2023-10-10 DIAGNOSIS — Z860101 Personal history of adenomatous and serrated colon polyps: Secondary | ICD-10-CM | POA: Diagnosis not present

## 2023-10-10 DIAGNOSIS — Z09 Encounter for follow-up examination after completed treatment for conditions other than malignant neoplasm: Secondary | ICD-10-CM | POA: Diagnosis not present

## 2023-10-10 DIAGNOSIS — Z860100 Personal history of colon polyps, unspecified: Secondary | ICD-10-CM | POA: Diagnosis not present

## 2023-10-10 DIAGNOSIS — K6389 Other specified diseases of intestine: Secondary | ICD-10-CM | POA: Diagnosis not present

## 2023-10-10 DIAGNOSIS — K648 Other hemorrhoids: Secondary | ICD-10-CM | POA: Diagnosis not present

## 2023-12-13 ENCOUNTER — Other Ambulatory Visit: Payer: Self-pay | Admitting: Cardiovascular Disease

## 2023-12-13 DIAGNOSIS — E7800 Pure hypercholesterolemia, unspecified: Secondary | ICD-10-CM

## 2023-12-13 DIAGNOSIS — I2510 Atherosclerotic heart disease of native coronary artery without angina pectoris: Secondary | ICD-10-CM

## 2023-12-14 ENCOUNTER — Telehealth: Payer: Self-pay | Admitting: Pharmacist

## 2023-12-14 NOTE — Telephone Encounter (Signed)
 Pt c/o medication issue:  1. Name of Medication:   Evolocumab  (REPATHA  SURECLICK) 140 MG/ML SOAJ   2. How are you currently taking this medication (dosage and times per day)?   As prescribed  3. Are you having a reaction (difficulty breathing--STAT)?   4. What is your medication issue?   Patient is following-up on renewing his grant to get this medication.

## 2023-12-18 ENCOUNTER — Telehealth: Payer: Self-pay | Admitting: Pharmacy Technician

## 2023-12-18 NOTE — Telephone Encounter (Signed)
   I will then

## 2023-12-18 NOTE — Telephone Encounter (Signed)
 SABRA

## 2023-12-21 DIAGNOSIS — Z23 Encounter for immunization: Secondary | ICD-10-CM | POA: Diagnosis not present

## 2023-12-21 DIAGNOSIS — M79671 Pain in right foot: Secondary | ICD-10-CM | POA: Diagnosis not present

## 2023-12-21 DIAGNOSIS — M25512 Pain in left shoulder: Secondary | ICD-10-CM | POA: Diagnosis not present

## 2023-12-21 DIAGNOSIS — Z6829 Body mass index (BMI) 29.0-29.9, adult: Secondary | ICD-10-CM | POA: Diagnosis not present

## 2023-12-21 NOTE — Telephone Encounter (Signed)
 Patient Advocate Encounter   The patient was approved for a [employer] grant that will help cover the cost of repatha  Total amount awarded, 2500.  Effective: 01/22/24 - 01/20/25   APW:[id number] ERW:[id number] Hmnle:[id number] PI:[id number] [employer] ID: [id number]   Pharmacy provided with approval and processing information. Patient informed via MYchart

## 2024-01-01 DIAGNOSIS — M25512 Pain in left shoulder: Secondary | ICD-10-CM | POA: Diagnosis not present

## 2024-03-03 ENCOUNTER — Other Ambulatory Visit: Payer: Self-pay | Admitting: Cardiovascular Disease

## 2024-03-03 DIAGNOSIS — E7800 Pure hypercholesterolemia, unspecified: Secondary | ICD-10-CM

## 2024-03-03 DIAGNOSIS — I2510 Atherosclerotic heart disease of native coronary artery without angina pectoris: Secondary | ICD-10-CM

## 2024-07-02 ENCOUNTER — Other Ambulatory Visit (HOSPITAL_COMMUNITY)

## 2024-07-03 ENCOUNTER — Encounter (INDEPENDENT_AMBULATORY_CARE_PROVIDER_SITE_OTHER): Admitting: Ophthalmology

## 2024-07-09 ENCOUNTER — Ambulatory Visit: Admitting: Physician Assistant
